# Patient Record
Sex: Female | Born: 1995 | Race: White | Hispanic: No | Marital: Single | Smoking: Former smoker
Health system: Southern US, Community
[De-identification: ages and names within clinical notes are randomized; demographics above are authoritative.]

## PROBLEM LIST (undated history)

## (undated) DIAGNOSIS — T7840XA Allergy, unspecified, initial encounter: Secondary | ICD-10-CM

## (undated) DIAGNOSIS — F988 Other specified behavioral and emotional disorders with onset usually occurring in childhood and adolescence: Secondary | ICD-10-CM

## (undated) HISTORY — DX: Morbid (severe) obesity due to excess calories: E66.01

## (undated) HISTORY — DX: Other specified behavioral and emotional disorders with onset usually occurring in childhood and adolescence: F98.8

## (undated) HISTORY — DX: Allergy, unspecified, initial encounter: T78.40XA

---
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
## 2017-12-29 ENCOUNTER — Emergency Department
Admission: EM | Admit: 2017-12-29 | Discharge: 2017-12-29 | Disposition: A | Discharge status: Home / Self Care | Payer: 59 | Attending: Internal Medicine | Admitting: Internal Medicine

## 2017-12-29 ENCOUNTER — Ambulatory Visit: Payer: 59 | Attending: Medical | Admitting: Medical

## 2017-12-29 ENCOUNTER — Encounter (RURAL_HEALTH_CENTER): Payer: Self-pay | Admitting: Medical

## 2017-12-29 ENCOUNTER — Emergency Department: Payer: 59

## 2017-12-29 VITALS — BP 137/84 | HR 91 | Temp 100.9°F | Resp 20

## 2017-12-29 DIAGNOSIS — J029 Acute pharyngitis, unspecified: Secondary | ICD-10-CM

## 2017-12-29 DIAGNOSIS — J038 Acute tonsillitis due to other specified organisms: Secondary | ICD-10-CM | POA: Insufficient documentation

## 2017-12-29 DIAGNOSIS — J020 Streptococcal pharyngitis: Secondary | ICD-10-CM | POA: Insufficient documentation

## 2017-12-29 DIAGNOSIS — B9689 Other specified bacterial agents as the cause of diseases classified elsewhere: Secondary | ICD-10-CM | POA: Insufficient documentation

## 2017-12-29 LAB — COMPREHENSIVE METABOLIC PANEL
ALT: 52 U/L (ref 0–55)
AST (SGOT): 60 U/L — ABNORMAL HIGH (ref 10–42)
Albumin/Globulin Ratio: 1.08 Ratio (ref 0.80–2.00)
Albumin: 4 gm/dL (ref 3.5–5.0)
Alkaline Phosphatase: 105 U/L (ref 40–145)
Anion Gap: 14.6 mMol/L (ref 7.0–18.0)
BUN / Creatinine Ratio: 9.1 Ratio — ABNORMAL LOW (ref 10.0–30.0)
BUN: 6 mg/dL — ABNORMAL LOW (ref 7–22)
Bilirubin, Total: 0.7 mg/dL (ref 0.1–1.2)
CO2: 27 mMol/L (ref 20.0–30.0)
Calcium: 9.2 mg/dL (ref 8.5–10.5)
Chloride: 102 mMol/L (ref 98–110)
Creatinine: 0.66 mg/dL (ref 0.60–1.20)
EGFR: 128 mL/min/{1.73_m2} (ref 60–150)
Globulin: 3.7 gm/dL (ref 2.0–4.0)
Glucose: 89 mg/dL (ref 71–99)
Osmolality Calculated: 275 mOsm/kg (ref 275–300)
Potassium: 4.3 mMol/L (ref 3.5–5.3)
Protein, Total: 7.7 gm/dL (ref 6.0–8.3)
Sodium: 139 mMol/L (ref 136–147)

## 2017-12-29 LAB — CBC AND DIFFERENTIAL
Bands: 18 % — ABNORMAL HIGH (ref 0–10)
Basophils %: 1 % (ref 0.0–3.0)
Basophils Absolute: 0.1 10*3/uL (ref 0.0–0.3)
Eosinophils %: 1 % (ref 0.0–7.0)
Eosinophils Absolute: 0.1 10*3/uL (ref 0.0–0.8)
Hematocrit: 37.2 % (ref 36.0–48.0)
Hemoglobin: 11.4 gm/dL — ABNORMAL LOW (ref 12.0–16.0)
Lymphocytes Absolute: 2.6 10*3/uL (ref 0.6–5.1)
Lymphocytes: 40 % (ref 15.0–46.0)
MCH: 23 pg — ABNORMAL LOW (ref 28–35)
MCHC: 31 gm/dL (ref 31–36)
MCV: 76 fL — ABNORMAL LOW (ref 80–100)
MPV: 8 fL (ref 6.0–10.0)
Monocytes Absolute: 0.5 10*3/uL (ref 0.1–1.7)
Monocytes: 7 % (ref 3.0–15.0)
Neutrophils %: 33 % — ABNORMAL LOW (ref 42.0–78.0)
Neutrophils Absolute: 3.4 10*3/uL (ref 1.7–8.6)
PLT CT: 134 10*3/uL (ref 130–440)
RBC: 4.91 10*6/uL (ref 3.80–5.00)
RDW: 13.9 % (ref 10.5–14.5)
WBC: 6.6 10*3/uL (ref 4.0–11.0)

## 2017-12-29 LAB — URINE HCG QUALITATIVE: Urine HCG Qualitative: NEGATIVE

## 2017-12-29 LAB — POCT RAPID STREP A: Rapid Strep A Screen POCT: POSITIVE — AB

## 2017-12-29 MED ORDER — AMOXICILLIN 400 MG/5ML PO SUSR
500.00 mg | Freq: Two times a day (BID) | ORAL | 0 refills | Status: AC
Start: 2017-12-29 — End: 2018-01-08

## 2017-12-29 MED ORDER — ACETAMINOPHEN 160 MG/5ML PO SOLN
650.00 mg | Freq: Once | ORAL | Status: AC
Start: 2017-12-29 — End: 2017-12-29
  Administered 2017-12-29: 650 mg via ORAL

## 2017-12-29 MED ORDER — IOHEXOL 350 MG/ML IV SOLN
100.00 mL | Freq: Once | INTRAVENOUS | Status: AC | PRN
Start: 2017-12-29 — End: 2017-12-29
  Administered 2017-12-29: 19:00:00 100 mL via INTRAVENOUS

## 2017-12-29 MED ORDER — METHYLPREDNISOLONE SODIUM SUCC 125 MG IJ SOLR
125.00 mg | Freq: Once | INTRAMUSCULAR | Status: AC
Start: 2017-12-29 — End: 2017-12-29
  Administered 2017-12-29: 18:00:00 125 mg via INTRAVENOUS

## 2017-12-29 MED ORDER — ACETAMINOPHEN 160 MG/5ML PO SOLN
ORAL | Status: AC
Start: 2017-12-29 — End: ?
  Filled 2017-12-29: qty 25.38

## 2017-12-29 MED ORDER — AMOXICILLIN 250 MG/5ML PO SUSR
500.00 mg | Freq: Once | ORAL | Status: AC
Start: 2017-12-29 — End: 2017-12-29
  Administered 2017-12-29: 18:00:00 500 mg via ORAL

## 2017-12-29 MED ORDER — PREDNISONE 20 MG PO TABS
60.00 mg | ORAL_TABLET | Freq: Every day | ORAL | 0 refills | Status: AC
Start: 2017-12-29 — End: 2018-01-03

## 2017-12-29 MED ORDER — METHYLPREDNISOLONE SODIUM SUCC 125 MG IJ SOLR
INTRAMUSCULAR | Status: AC
Start: 2017-12-29 — End: ?
  Filled 2017-12-29: qty 2

## 2017-12-29 MED ORDER — AMOXICILLIN 250 MG/5ML PO SUSR
ORAL | Status: AC
Start: 2017-12-29 — End: ?
  Filled 2017-12-29: qty 80

## 2017-12-29 NOTE — Progress Notes (Signed)
PROGRESS NOTE    Date Time: 12/29/2017 4:19 PM  Patient Name: Tasha [name]  Primary Care Physician: Brion Aliment, MD      History of Presenting Illness:   Yanira A Neumeier is a 22 y.o. female who presents to the office with complaints of a sore throat and fever. She reports her symptoms started 3 days ago. Her Tmax yesterday was 101F. She did take some Tylenol yesterday. She has not taken any medications today. She denies abdominal, nausea, vomiting. She complains of difficulty swallowing, she has only consumed liquids the past 3 days. She denies any difficulty breathing. Other symptoms include a dry cough.     Past Medical History:   No past medical history on file.    Past Surgical History:   No past surgical history on file.    Family History:     Family History   Problem Relation Age of Onset   . Hypertension Mother    . Diabetes Mother    . Thyroid disease Mother    . Migraines Mother    . Polycystic ovary syndrome Mother    . Hypertension Father    . Migraines Maternal Aunt    . Hypertension Maternal Grandmother    . Migraines Maternal Grandmother        Social History:     Social History     Tobacco Use   Smoking Status Never Smoker   Smokeless Tobacco Never Used     Social History     Substance and Sexual Activity   Alcohol Use No     Social History     Substance and Sexual Activity   Drug Use No       Allergies:   No Known Allergies    Medications:     Prior to Admission medications    Medication Sig Start Date End Date Taking? Authorizing Provider   Multiple Vitamin (MULTIVITAMIN) tablet Take 1 tablet by mouth daily.    [provider]       Review of Systems:   Review of Systems   Constitutional: Positive for fever. Negative for appetite change.        Facial edema   HENT: Positive for trouble swallowing and voice change. Negative for congestion, sinus pressure and sinus pain.    Respiratory: Positive for cough.    Gastrointestinal: Negative for abdominal pain, nausea and vomiting.        Physical Exam:     Vitals:    12/29/17 1542   BP: 137/84   Pulse: 91   Resp: 20   Temp: (!) 100.9 F (38.3 C)   SpO2: 98%     There is no height or weight on file to calculate BMI.    Physical Exam  Vitals signs and nursing note reviewed.   Constitutional:       Appearance: She is well-developed.   HENT:      Head: Normocephalic and atraumatic.      Comments: Periorbital edema     Mouth/Throat:      Pharynx: Oropharyngeal exudate and posterior oropharyngeal erythema present.      Tonsils: Swelling: 4+ on the right. 4+ on the left.   Cardiovascular:      Rate and Rhythm: Normal rate and regular rhythm.      Heart sounds: Normal heart sounds.   Pulmonary:      Effort: Pulmonary effort is normal.      Breath sounds: Normal breath sounds.   Skin:  Findings: No rash.   Neurological:      Mental Status: She is alert and oriented to person, place, and time.       Rapid strep done today 12/29/17    POCT QC Pass   Final   Rapid Strep A Screen POCT PositiveAbnormal   Negative Final   Comment   Final       Assessment:     1. Pharyngitis, unspecified etiology  POCT Rapid strep A    Throat Culture       Plan:     Due to her complaints of difficulty and painful swallowing will refer to ED for imaging and labs. Rapid strep was faintly positive in clinic. Patient and mother are agreeable with plan of care. Report called to ER physician.     Signed by: Everlene Other, PA-C

## 2017-12-29 NOTE — Discharge Instructions (Signed)
Pharyngitis, Strep    You have been diagnosed with strep pharyngitis.    Strep pharyngitis (strep throat) is an infection in the back of the throat and tonsils. It is caused by bacteria called Streptococcus pyogenes. The word "pharyngitis" means "sore throat." The doctor might use a test to find out whether your child's sore throat was caused by strep bacteria or a virus. If a virus caused the sore throat, antibiotics will not help. Taking antibiotics when they are not needed is dangerous because it leads to resistance. This means the drugs will not work as well when they are needed the next time.    Symptoms of strep pharyngitis include fever (temperature higher than 100.4F / 38C), sore throat, painful swallowing, headache, abdominal pain and vomiting. You might see a rash that feels like sandpaper. You might have swollen tender neck glands. There are usually white spots on the tonsils.    Most people with cold symptoms (runny or stuffy nose or cough) do NOT have strep throat, even if their throats are sore. The doctor might not test you for strep throat. Some people have strep bacteria in the throat all the time but don't get sick. We call these people "carriers." A carrier will always test positive for strep, even though the strep didn't cause your sore throat. If all 4 of the following are true, it is likely your sore throat was caused by strep bacteria:   You have a fever (temperature higher than 100.4F / 38C), either here or at home.   There are white spots in the back of your throat.   Your lymph nodes (glands) are swollen.   You have NO other cold symptoms, like a stuffy nose, runny nose, or cough.    Strep pharyngitis is treated with antibiotics, medicine for pain and fever (temperature higher than 100.4F / 38C), and fluids. It is VERY IMPORTANT that you take all of the antibiotics as directed, even if you are feeling better before the antibiotics are finished.    YOU SHOULD SEEK MEDICAL  ATTENTION IMMEDIATELY, EITHER HERE OR AT THE NEAREST EMERGENCY DEPARTMENT, IF ANY OF THE FOLLOWING OCCURS:   You have trouble breathing.   You are hoarse or your voice changes.   You drool or have difficulty swallowing.   Your sore throat gets worse or you start to have neck pain.   You are unable to take liquids or medicine.   You get worse at any time or do not get better in 2 to 3 days.

## 2017-12-29 NOTE — ED Provider Notes (Signed)
Physician/Midlevel provider first contact with patient: 12/29/17 1720         History     Chief Complaint   Patient presents with   . Sore Throat     The patient is a very pleasant 22 year old woman who presents to the ER complaining of a 2-day history of a sore throat. She was seen by her primary care provider just prior to arrival and was noted to have significantly swollen tonsils. For this reason, she was advised to come to the ER. She did have a strep test that came back positive at the primary care provider's office. The patient reports that she has moderate, sharp, non radiating pain located in the back of her throat that is worsened with oral intake. She is able to swallow liquid without any difficulty whatsoever and she has no shortness of breath. However, she reports that when she swallows solids, she has some mild difficulty with this. She has no lightheadedness or dizziness or syncope or presyncope. She has fevers and chills. She has not been on antibiotics recently. She confirms that she is not pregnant.    History reviewed. No pertinent past medical history.  Morbid obesity    History reviewed. No pertinent surgical history.  No coagulopathy    Family History   Problem Relation Age of Onset   . Hypertension Mother    . Diabetes Mother    . Thyroid disease Mother    . Migraines Mother    . Polycystic ovary syndrome Mother    . Hypertension Father    . Migraines Maternal Aunt    . Hypertension Maternal Grandmother    . Migraines Maternal Grandmother        Social  Social History     Tobacco Use   . Smoking status: Never Smoker   . Smokeless tobacco: Never Used   Substance Use Topics   . Alcohol use: No   . Drug use: No       .     No Known Allergies    Home Medications     Med List Status:  Complete Set By: Osa Craver, RN at 12/29/2017  5:34 PM                Multiple Vitamin (MULTIVITAMIN) tablet     Take 1 tablet by mouth daily.           Review of Systems   ROS Statements  All systems rev &  neg except as marked.    Constitutional     Denies:  Fatigue, Lethargy, Malaise, Recent wt loss, Weakness - generalized.   GI  Denies: Abdominal pain, Anorexia, Belching, Bloody/tarry stool, Constipation, Diarrhea, Dysphagia, Hematemesis, Hematochezia, Mucousy stool, Melena, Nausea, Rectal pain, Vomiting.   GU   Denies: Dysuria, Flank pain, Hematuria, Incontinence, Nocturia, Urinary frequency, Urinary urgency, Urination decreased, Urination increased.   Musculoskeletal     Denies: Back pain, Extremity pain, Extremity swelling, Joint pain, Joint swelling, Lumbar pain, Myalgia, Neck pain, Thoracic pain.   Skin     Denies: Abrasion, Abscess, Burn, Contusion, Diaphoresis, Erythema, Itching, Jaundice, Laceration, Rash, Swelling, Ulceration.   Eyes     Denies: Blurred R, Blurred L, Blurred bilat, Diplopia, Discharge R, Discharge L, Discharge bilat, Eye pain R, Eye pain L, Eye pain bilat, Photophobia, Redness R, Redness L, Redness bilat, Swelling R, Swelling L, Swelling bilat, Visual loss R, Visual loss L, Visual loss bilat, Yellow R, Yellow L, Yellow bilat.   Ears/Nose/Throat  Denies: Ear drainage R, Ear drainage L, Ear drainage bilat, Ear ringing R, Ear ringing L, Ear ringing bilat, Earache R, Earache L, Earache bilat, Hearing loss R, Hearing loss L, Hearing loss bilat, Mouth pain, Nasal congestion, Nose bleeding, Sinus problem, Tongue pain, Tongue swelling, Toothache.   Respiratory     Denies: Cough, non-productive, Cough, productive, Dyspnea on exertion, Hemoptysis, Parox nocturnal dyspnea, Pleuritic pain, Shortness of breath, Wheezing.   Cardiovascular     Denies: Chest pain, Dyspnea on exertion, Edema, Orthopnea, Palpitations, Parox nocturnal dyspnea, Syncope.   Hematologic     Denies: Bleeding, Bruising, Petechiae.   Endocrine     Denies: Cold intolerance, Heat intolerance, Polydipsia, Polyphagia, Polyuria, Weight gain, Weight loss.   Allergy/Immun     Denies: Allergic reaction, Anaphylaxis, Hives, Itching,  Rhinorrhea, Sneezing.   Neurologic     Denies: Abnormal movement, Bladder dysfunction, Bowel dysfunction, Change LOC, Confusion, Dizziness, Focal weakness, Generalized weakness, Headache, Lightheaded, Numbness, Problem walking, Seizure, Shaking, Slurred speech, Spinning sensation, Syncope, Tingling, Unable to speak, Vision change.   Psychiatric     Denies: Agitation, Anxiety, Change mental status, Confusion, Delusional, Depression, Hallucinations, auditory, Hallucinations, visual, Homicidal ideation, Hostile, Insomnia, Stress, Suicidal ideation, Unable to control self.     Physical Exam    BP: 161/86, Heart Rate: 99, Temp: (!) 101.1 F (38.4 C), Resp Rate: 16, SpO2: 100 %, Weight: 146.1 kg     General/Const     General/Const Awake, Alert, No acute distress, Well appearing, Well developed, Well hydrated, Well nourished, Cooperative, Not toxic appearing  Abdomen/GI     Abdomen/GI Atraumatic, Soft, Non-tender, McBurney's non-tender, No guarding, No rebound, BS normoactive, No distention, No palpable mass, No pulsatile mass  Skin     Skin Atraumatic, Color NL, No rash, Warm, Dry, Intact, Turgor NL, No swelling  Genitourinary         Patient refused exam  MS Head     Head Atraumatic, Normocephalic  Eyes     Eyes Atraumatic, PERRL, EOMI, No nystagmus, No periorbital redness, No periorbital swelling, No photophobia, No scleral icterus, Conjunctiva NL, Cornea clear, Eyelids NL, Temporal arteries NL, Visual acuity NL  Ears/Nose/Throat     Ears/Nose/Throat Atraumatic, Airway patent, Mucous membranes moist, Pharynx reddened with tonsils enlarged bilaterally with pus noted on them c/w pustular tonsillitis, No peritonsillar abscess, No pooling of secretions, No trismus, Tympanic membs NL, Ext aud canal NL, Mastoid area NL, Nose exam NL, No sinus tenderness, No facial swelling  MS Neck     Neck Atraumatic, Supple, No meningismus, Full range of motion, + cervical anterior adenopathy, Non-tender, No midline vertebral tend, No  masses, No crepitus, No JVD, No carotid bruit, No tracheal deviation  Resp/Chest     Respiratory/Chest Atraumatic, Breath sounds NL, Breath sounds = bilat, No respiratory distress, No rales, No rhonchi, No wheezing, No retractions, No stridor, No chest tenderness, No chest wall deformity, No crepitus  Cardiovascular     Cardiovascular Heart rate NL, Regular rhythm, Heart sounds NL, No gallop, No murmurs, No rubs, Cap refill not delayed, Peripheral circulation NL, Pulses = bilaterally, No gross BP differential  MS Back     Back Atraumatic, Inspection NL, Full range of motion, Painless range of motion, Non-tender, No midline vertebral tend, No paraspinal tenderness, No muscle spasm, Straight leg raise neg, No CVA tenderness  Lymphatic     Lymphatic No gross adenopathy, No cervical adenopathy, No axillary adenopathy, No inguinal adenopathy  MS Upper Extrem     Upper Extremity/MS Atraumatic,  Inspection NL, Full range of motion, No swelling, Non-tender, No snuffbox tenderness, No erythema, No deformity, Neurologic intact, Vascular intact, No ligamentous injury, Tendon function NL, No compartment syndrome, No circumferential injury, No clubbing/cyanosis, No edema  MS Wrist/Hand     Wrist/Hand Atraumatic, Inspection NL, Full range of motion, No swelling, No erythema, Non-tender, No snuffbox tenderness, No deformity, Neurologic intact, Vascular intact, No ligamentous injury, Tendon function NL, No compartment syndrome, No circumferential injury, No clubbing/cyanosis, No edema  MS Lower Extrem     Lower Ext/Pelvis/MS Atraumatic, Inspection NL, Full range of motion, No swelling, Non-tender, No erythema, No deformity, Neurologic intact, Vascular intact, No ligamentous injury, Tendon function NL, No compartment syndrome, No circumferential injury, No edema, Gait NL, Pelvis stable, Pelvis non-tender  MS Ankle/Foot     Ankle/Foot Atraumatic, Inspection NL, Full range of motion, No swelling, No erythema, Non-tender, No deformity,  Neurologic intact, Vascular intact, No ligamentous injury, Tendon function NL, No compartment syndrome, No circumferential injury, No edema, Gait NL  Rectum     Rectum/Perineum Patient refused exam  Neurologic     Neurologic Oriented X3, Speech NL, No motor deficits, No sensory deficits, CN II - XII intact, Reflexes equal bilat, Cerebellar NL, Memory NL, Gait NL  Psychiatric     Psychiatric Affect NL, Mood NL, Not suicidal, Not homicidal, No hallucinations, Cognitive function NL, Judgment/insight NL, Thought content NL    MDM and ED Course     ED Medication Orders (From admission, onward)    Start Ordered     Status Ordering Provider    12/29/17 1849 12/29/17 1849  iohexol (OMNIPAQUE) 350 MG/ML injection 100 mL  IMG once as needed     Route: Intravenous  Ordered Dose: 100 mL     Last MAR action:  Imaging Agent Given Osa Craver    12/29/17 1746 12/29/17 1745  methylPREDNISolone sodium succinate (Solu-MEDROL) injection 125 mg  Once in ED     Route: Intravenous  Ordered Dose: 125 mg     Last MAR action:  Given Osa Craver    12/29/17 1746 12/29/17 1745  amoxicillin (AMOXIL) 250 MG/5ML oral suspension 500 mg  Once in ED     Route: Oral  Ordered Dose: 500 mg     Last MAR action:  Given Osa Craver    12/29/17 1737 12/29/17 1736  acetaminophen (TYLENOL) 160 MG/5ML oral solution 650 mg  Once in ED     Route: Oral  Ordered Dose: 650 mg     Last MAR action:  Given Karena Kinker, Murlean Iba S           MDM  The patient is resting comfortably and is well-appearing and in no acute distress. There is no respiratory distress, no stridor, and the mental status is normal. The neurologic examination is normal, there is no significant dehydration, and the patient is able to tolerate PO fluids. The history, exam, diagnostic testing, and the patient's current condition do not suggest an infectious process such as meningitis, retropharyngeal abscess, epiglottitis, peritonsillar abscess, Ludwig's angina, mastoiditis, orbital cellulitis,  periorbital cellulitis, severe sinusitis, malignant otitis externa, sepsis or other significant pathology to warrant further testing, continued ED treatment, admission, consultation or other specialist evaluation at this time. The vital signs have been stable. The patient's condition is stable and appropriate for discharge.     Ct Soft Tissue Neck W Contrast    Result Date: 12/29/2017  Adenoid and palatine tonsillar hypertrophy bilaterally. No organized/drainable fluid collection. Marked bilateral cervical  lymphadenopathy. ReadingStation:[id number]      Procedures    Clinical Impression & Disposition     Clinical Impression  Final diagnoses:   Acute streptococcal pharyngitis   Acute bacterial tonsillitis        ED Disposition     ED Disposition Condition Date/Time Comment    Discharge  Mon Dec 29, 2017  7:15 PM Tasha [name] discharge to home/self care.    Condition at disposition: Stable           New Prescriptions    AMOXICILLIN (AMOXIL) 400 MG/5ML SUSPENSION    Take 6.5 mLs (520 mg total) by mouth 2 (two) times daily for 10 days    PREDNISONE (DELTASONE) 20 MG TABLET    Take 3 tablets (60 mg total) by mouth daily for 5 days        DISCHARGE:  I have spoken with the patient and/or caregivers. I have explained the patient's condition, diagnoses and treatment plan based on the information available to me at this time. I have answered the patient's and/or caregiver's questions and addressed any concerns. The patient and/or caregivers have as good an understanding of the patient's diagnosis, condition and treatment plan as can be expected at this point. The vital signs have been stable. The patient's condition is stable and appropriate for discharge from the emergency department.  The patient will pursue further outpatient evaluation with the primary care physician or other designated or consulting physician as outlined in the discharge instructions. The patient and/or caregivers are agreeable to this plan of  care and follow-up instructions have been explained in detail. The patient and/or caregivers have received these instructions in written format and have expressed an understanding of the discharge instructions. The patient and/or caregivers are aware that any significant change in condition or worsening of symptoms should prompt an immediate return to this or the closest emergency department or a call to 911.       Osa Craver, MD  12/29/17 [phone]

## 2018-04-21 ENCOUNTER — Other Ambulatory Visit
Admission: RE | Admit: 2018-04-21 | Discharge: 2018-04-21 | Disposition: A | Discharge status: Home / Self Care | Payer: 59 | Source: Ambulatory Visit | Attending: Family Medicine | Admitting: Family Medicine

## 2018-04-21 ENCOUNTER — Ambulatory Visit: Payer: 59 | Attending: Medical | Admitting: Family Medicine

## 2018-04-21 ENCOUNTER — Encounter (RURAL_HEALTH_CENTER): Payer: Self-pay | Admitting: Medical

## 2018-04-21 VITALS — BP 151/76 | HR 70 | Temp 97.9°F | Resp 18

## 2018-04-21 DIAGNOSIS — J029 Acute pharyngitis, unspecified: Secondary | ICD-10-CM

## 2018-04-21 LAB — POCT RAPID STREP A: Rapid Strep A Screen POCT: NEGATIVE

## 2018-04-21 MED ORDER — PREDNISONE 20 MG PO TABS
40.00 mg | ORAL_TABLET | Freq: Every day | ORAL | 0 refills | Status: AC
Start: 2018-04-21 — End: ?

## 2018-04-21 MED ORDER — CEPHALEXIN 500 MG PO CAPS
500.00 mg | ORAL_CAPSULE | Freq: Three times a day (TID) | ORAL | 0 refills | Status: AC
Start: 2018-04-21 — End: 2018-05-01

## 2018-04-21 NOTE — Progress Notes (Signed)
Subjective:    Patient ID: Tasha [name] is a 23 y.o. female.    HPI  The patient presents today with a history of a sore throat over the course of the last several days.  She has taken vitamins that she purchased at [facility] to try to help boost her immune system which she thinks have helped somewhat.  She has not had a documented fever.  She does have a history of severe streptococcal pharyngitis in the past.  She reports that when she took amoxicillin she broke out with a nonpruritic rash approximately 4 days into the antibiotic course.  The following portions of the patient's history were reviewed and updated as appropriate: allergies, current medications, past family history, past medical history, past social history and problem list.    Review of Systems   Constitutional: Negative for fever.   HENT: Positive for sore throat.            Objective:    Physical Exam  Constitutional:       Appearance: Normal appearance.      Comments: The patient does speak with a hot potato voice   HENT:      Head: Normocephalic and atraumatic.      Mouth/Throat:      Mouth: Mucous membranes are moist.      Comments: Significant tonsillar enlargement without exudate  Eyes:      General: No scleral icterus.  Neck:      Musculoskeletal: Neck supple.   Cardiovascular:      Rate and Rhythm: Normal rate and regular rhythm.   Pulmonary:      Effort: Pulmonary effort is normal.      Breath sounds: Normal breath sounds.   Neurological:      Mental Status: She is alert.             Assessment:       1. Sore throat          Plan:       Rapid strep test was negative.  Given her history, however, I will cover with antibiotics and steroids until the culture returns.  Patient to follow-up if worsening in any way.

## 2018-04-23 ENCOUNTER — Telehealth (RURAL_HEALTH_CENTER): Payer: Self-pay | Admitting: Family Medicine

## 2018-04-23 NOTE — Telephone Encounter (Signed)
Throat culture negative, good news

## 2018-04-23 NOTE — Telephone Encounter (Signed)
Left detailed message.     Thanks,   CF

## 2018-09-01 ENCOUNTER — Emergency Department (HOSPITAL_COMMUNITY)
Admission: EM | Admit: 2018-09-01 | Discharge: 2018-09-01 | Disposition: A | Discharge status: Home / Self Care | Payer: 59 | Attending: Emergency Medicine | Admitting: Emergency Medicine

## 2018-09-01 ENCOUNTER — Other Ambulatory Visit: Payer: Self-pay

## 2018-09-01 ENCOUNTER — Emergency Department (HOSPITAL_COMMUNITY): Payer: 59

## 2018-09-01 ENCOUNTER — Encounter (HOSPITAL_COMMUNITY): Payer: Self-pay | Admitting: *Deleted

## 2018-09-01 DIAGNOSIS — M25552 Pain in left hip: Secondary | ICD-10-CM

## 2018-09-01 DIAGNOSIS — Z720 Tobacco use: Secondary | ICD-10-CM | POA: Diagnosis not present

## 2018-09-01 DIAGNOSIS — M545 Low back pain, unspecified: Secondary | ICD-10-CM

## 2018-09-01 MED ORDER — NAPROXEN 500 MG PO TABS: 500.0000 mg | ORAL_TABLET | Freq: Two times a day (BID) | ORAL | 0 refills | Status: AC

## 2018-09-01 NOTE — Discharge Instructions (Signed)
Please see the information and instructions below regarding your visit.  Your diagnoses today include:  1. Acute left-sided low back pain without sciatica   2. Left hip pain    About diagnosis. Most episodes of acute low back pain are self-limited. Your exam was reassuring today that the source of your pain is not affecting the spinal cord and nerves that originate in the spinal cord.   Tests performed today include: See side panel of your discharge paperwork for testing performed today. Vital signs are listed at the bottom of these instructions.   Medications prescribed:    Take any prescribed medications only as prescribed, and any over the counter medications only as directed on the packaging.  You are prescribed naproxen, a non-steroidal anti-inflammatory agent (NSAID) for pain. You may take 500 mg every 12 hours as needed for pain. If still requiring this medication around the clock for acute pain after 10 days, please see your primary healthcare provider.  Women who are pregnant, breastfeeding, or planning on becoming pregnant should not take non-steroidal anti-inflammatories such as Advil and Aleve. Tylenol is a safe over the counter pain reliever in pregnant women.  You may combine this medication with Tylenol, 650 mg every 6 hours, so you are receiving something for pain every 3 hours.  This is not a long-term medication unless under the care and direction of your primary provider. Taking this medication long-term and not under the supervision of a healthcare provider could increase the risk of stomach ulcers, kidney problems, and cardiovascular problems such as high blood pressure.    Home care instructions:   Low back pain gets worse the longer you stay stationary. Please keep moving and walking as tolerated. There are exercises included in this packet to perform as tolerated for your low back pain.   Apply heat to the areas that are painful. Avoid twisting or bending your  trunk to lift something. Do not lift anything above 25 lbs while recovering from this flare of low back pain.  Please follow any educational materials contained in this packet.   Follow-up instructions: Please follow-up with your primary care provider as needed for further evaluation of your symptoms if they are not completely improved.   Please follow up with Dr. Aline Brochure of orthopedics if not improving in one week.   Return instructions:  Please return to the Emergency Department if you experience worsening symptoms.  Please return for any fever or chills in the setting of your back pain, weakness in the muscles of the legs, numbness in your legs and feet that is new or changing, numbness in the area where you wipe, retention of your urine, loss of bowel or bladder control, or problems with walking. Please return if you have any other emergent concerns.  Additional Information:   Your vital signs today were: BP (!) 131/96 (BP Location: Right Arm)    Pulse 90    Temp 98.4 F (36.9 C) (Oral)    Resp 18    Ht 6\' 2"  (1.88 m)    Wt 113.4 kg    SpO2 100%    BMI 32.10 kg/m  If your blood pressure (BP) was elevated on multiple readings during this visit above 130 for the top number or above 80 for the bottom number, please have this repeated by your primary care provider within one month. --------------  Thank you for allowing [place] to participate in your care today.

## 2018-09-01 NOTE — ED Triage Notes (Signed)
Pt states she injured her left hip a few years ago and still continues to have pain if she overuses; pt states she went swimming x 5 days ago and has been having pain to that left leg and hip since; pt denies any new injuries

## 2018-09-01 NOTE — ED Provider Notes (Signed)
[facility] EMERGENCY DEPARTMENT Provider Note   CSN: [id number] Arrival date & time: 09/01/18  1501     History   Chief Complaint Chief Complaint  Patient presents with  . Hip Pain    HPI Madison Clay is a 23 y.o. female.     HPI  Patient is a 23 year old female with past medical history of ADD and allergic rhinitis presenting for left back and hip pain.  Patient reports that she injured her back with some aggressive swimming 3 days ago.  She reports that she had a back injury 2 years ago and has intermittent symptoms of pain with lifting her leg ever since.  Currently, she reports that she has the greatest pain with hip flexion.  She denies any weakness or numbness in lower extremities, saddle anesthesia, loss of bowel or bladder control, fever or chills, IVDU history of cancer history.  Past Medical History:  Diagnosis Date  . ADD (attention deficit disorder)   . Allergy     There are no active problems to display for this patient.   History reviewed. No pertinent surgical history.   OB History   No obstetric history on file.      Home Medications    Prior to Admission medications   Medication Sig Start Date End Date Taking? Authorizing Provider  naproxen (NAPROSYN) 500 MG tablet Take 1 tablet (500 mg total) by mouth 2 (two) times daily. 09/01/18   Albesa Seen, PA-C    Family History History reviewed. No pertinent family history.  Social History Social History   Tobacco Use  . Smoking status: Current Some Day Smoker    Types: Cigarettes  . Smokeless tobacco: Never Used  . Tobacco comment: social smoker  Substance Use Topics  . Alcohol use: Yes    Comment: mostly on weekends  . Drug use: Yes    Types: Marijuana     Allergies   Patient has no known allergies.   Review of Systems Review of Systems  Constitutional: Negative for chills and fever.  Musculoskeletal: Positive for arthralgias and back pain.  Neurological: Negative for  weakness and numbness.     Physical Exam Updated Vital Signs BP (!) 131/96 (BP Location: Right Arm)   Pulse 90   Temp 98.4 F (36.9 C) (Oral)   Resp 18   Ht 6\' 2"  (1.88 m)   Wt 113.4 kg   SpO2 100%   BMI 32.10 kg/m   Physical Exam Vitals signs and nursing note reviewed.  Constitutional:      General: She is not in acute distress.    Appearance: She is well-developed. She is not diaphoretic.     Comments: Sitting comfortably in bed.  HENT:     Head: Normocephalic and atraumatic.  Eyes:     General:        Right eye: No discharge.        Left eye: No discharge.     Conjunctiva/sclera: Conjunctivae normal.     Comments: EOMs normal to gross examination.  Neck:     Musculoskeletal: Normal range of motion.  Cardiovascular:     Rate and Rhythm: Normal rate and regular rhythm.     Comments: Intact, 2+ DP pulse bilaterally. Pulmonary:     Comments: Converses comfortably.  No audible wheeze or stridor. Abdominal:     General: There is no distension.  Musculoskeletal: Normal range of motion.     Right lower leg: No edema.     Left lower  leg: No edema.     Comments: Spine Exam: Inspection/Palpation: No midline TTP of lumbar spine. Paraspinal muscular tenderness of left lumbar spine and left hip. Pain primary with passive/active hip flexion. Strength: 5/5 throughout LE bilaterally (hip flexion/extension, adduction/abduction; knee flexion/extension; foot dorsiflexion/plantarflexion, inversion/eversion; great toe inversion) Sensation: Intact to light touch in proximal and distal LE bilaterally Reflexes: 2+ quadriceps and achilles reflexes Normal and symmetric gait.   Skin:    General: Skin is warm and dry.  Neurological:     Mental Status: She is alert.     Comments: Cranial nerves intact to gross observation. Patient moves extremities without difficulty.  Psychiatric:        Behavior: Behavior normal.        Thought Content: Thought content normal.        Judgment:  Judgment normal.      ED Treatments / Results  Labs (all labs ordered are listed, but only abnormal results are displayed) Labs Reviewed - No data to display  EKG None  Radiology Dg Hip Unilat W Or Wo Pelvis 2-3 Views Left  Result Date: 09/01/2018 CLINICAL DATA:  Lateral left hip pain without injury. EXAM: DG HIP (WITH OR WITHOUT PELVIS) 2-3V LEFT COMPARISON:  None. FINDINGS: There is no evidence of hip fracture or dislocation. There is no evidence of arthropathy or other focal bone abnormality. IMPRESSION: Negative. Electronically Signed   By: Ted Mcalpineobrinka  Dimitrova M.D.   On: 09/01/2018 16:47    Procedures Procedures (including critical care time)  Medications Ordered in ED Medications - No data to display   Initial Impression / Assessment and Plan / ED Course  I have reviewed the triage vital signs and the nursing notes.  Pertinent labs & imaging results that were available during my care of the patient were reviewed by me and considered in my medical decision making (see chart for details).        This is a well-appearing 23 year old female with no significant past medical history presenting for left hip and left low back pain.  This was in the setting of an injury.  Patient has no red flag features for back or hip pain.  No history of immune compromise status.  Radiograph of the left hip demonstrates no bony lesions.  Reviewed by me.  Patient denies any chance of pregnancy and declines pregnancy test today.  Will recommend NSAIDs, activity restriction, and heat.  She is instructed to follow-up with orthopedics if not improving.  Patient is in understanding and agrees with the plan of care.  Final Clinical Impressions(s) / ED Diagnoses   Final diagnoses:  Acute left-sided low back pain without sciatica  Left hip pain    ED Discharge Orders         Ordered    naproxen (NAPROSYN) 500 MG tablet  2 times daily     09/01/18 1717           Elisha PonderMurray, Khaliq Turay B, PA-C  09/01/18 1815    Samuel JesterMcManus, Kathleen, DO 09/04/18 1544

## 2019-05-27 ENCOUNTER — Ambulatory Visit: Payer: 59 | Attending: Internal Medicine

## 2019-05-27 ENCOUNTER — Other Ambulatory Visit: Payer: Self-pay

## 2019-05-27 DIAGNOSIS — Z23 Encounter for immunization: Secondary | ICD-10-CM

## 2019-05-27 NOTE — Progress Notes (Signed)
   Covid-19 Vaccination Clinic  Name:  KYNZIE POLGAR    MRN: [id number] DOB: 1995/06/29  05/27/2019  Ms. Sortino was observed post Covid-19 immunization for 15 minutes without incident. She was provided with Vaccine Information Sheet and instruction to access the V-Safe system.   Ms. Dissinger was instructed to call 911 with any severe reactions post vaccine: Marland Kitchen Difficulty breathing  . Swelling of face and throat  . A fast heartbeat  . A bad rash all over body  . Dizziness and weakness   Immunizations Administered    Name Date Dose VIS Date Route   Moderna COVID-19 Vaccine 05/27/2019  8:18 AM 0.5 mL 01/19/2019 Intramuscular   Manufacturer: Moderna   Lot: [id number]   NDC: 58832-549-82

## 2019-06-29 ENCOUNTER — Ambulatory Visit: Payer: 59 | Attending: Internal Medicine

## 2019-06-29 DIAGNOSIS — Z23 Encounter for immunization: Secondary | ICD-10-CM

## 2019-06-29 NOTE — Progress Notes (Signed)
   Covid-19 Vaccination Clinic  Name:  Madison Clay    MRN: [id number] DOB: October 28, 1995  06/29/2019  [name] was observed post Covid-19 immunization for 15 minutes without incident. She was provided with Vaccine Information Sheet and instruction to access the V-Safe system.   [name] was instructed to call 911 with any severe reactions post vaccine: Marland Kitchen Difficulty breathing  . Swelling of face and throat  . A fast heartbeat  . A bad rash all over body  . Dizziness and weakness   Immunizations Administered    Name Date Dose VIS Date Route   Moderna COVID-19 Vaccine 06/29/2019  9:24 AM 0.5 mL 01/2019 Intramuscular   Manufacturer: Moderna   Lot: [id number]   NDC: 40102-725-36

## 2019-06-30 ENCOUNTER — Other Ambulatory Visit: Payer: Self-pay

## 2019-06-30 ENCOUNTER — Emergency Department (HOSPITAL_COMMUNITY)
Admission: EM | Admit: 2019-06-30 | Discharge: 2019-06-30 | Disposition: A | Discharge status: Home / Self Care | Payer: Self-pay | Attending: Emergency Medicine | Admitting: Emergency Medicine

## 2019-06-30 ENCOUNTER — Encounter (HOSPITAL_COMMUNITY): Payer: Self-pay | Admitting: Emergency Medicine

## 2019-06-30 DIAGNOSIS — F909 Attention-deficit hyperactivity disorder, unspecified type: Secondary | ICD-10-CM | POA: Insufficient documentation

## 2019-06-30 DIAGNOSIS — R519 Headache, unspecified: Secondary | ICD-10-CM | POA: Insufficient documentation

## 2019-06-30 DIAGNOSIS — Z79899 Other long term (current) drug therapy: Secondary | ICD-10-CM | POA: Insufficient documentation

## 2019-06-30 DIAGNOSIS — Z87891 Personal history of nicotine dependence: Secondary | ICD-10-CM | POA: Insufficient documentation

## 2019-06-30 MED ORDER — DEXAMETHASONE SODIUM PHOSPHATE 10 MG/ML IJ SOLN
10.0000 mg | Freq: Once | INTRAMUSCULAR | Status: AC
  Administered 2019-06-30: 16:00:00 10 mg via INTRAVENOUS
  Filled 2019-06-30: qty 1

## 2019-06-30 MED ORDER — PROMETHAZINE HCL 25 MG PO TABS: 25.0000 mg | ORAL_TABLET | Freq: Four times a day (QID) | ORAL | 0 refills | Status: AC | PRN

## 2019-06-30 MED ORDER — DIPHENHYDRAMINE HCL 50 MG/ML IJ SOLN
25.0000 mg | Freq: Once | INTRAMUSCULAR | Status: AC
Start: 2019-06-30 — End: 2019-06-30
  Administered 2019-06-30: 25 mg via INTRAVENOUS
  Filled 2019-06-30: qty 1

## 2019-06-30 MED ORDER — SODIUM CHLORIDE 0.9 % IV BOLUS
1000.0000 mL | Freq: Once | INTRAVENOUS | Status: AC
  Administered 2019-06-30: 1000 mL via INTRAVENOUS

## 2019-06-30 MED ORDER — PROCHLORPERAZINE EDISYLATE 10 MG/2ML IJ SOLN
10.0000 mg | Freq: Once | INTRAMUSCULAR | Status: AC
  Administered 2019-06-30: 16:00:00 10 mg via INTRAVENOUS
  Filled 2019-06-30: qty 2

## 2019-06-30 NOTE — ED Triage Notes (Signed)
Had 2nd moderna covid vaccine yesterday. Woke up with headache this morning. Took tylenol at 100 with no relief

## 2019-06-30 NOTE — Discharge Instructions (Addendum)
I suspect your headache and your low grade fever is a side effect of your 2nd covid vaccine as this a very common reaction to the second dose.  Rest, make sure you are drinking plenty of fluids.  You may continue taking tylenol or motrin if needed for a return of your headache.  I have prescribed you phenergan which you can take if your nausea returns.

## 2019-06-30 NOTE — ED Notes (Signed)
Pt ambulatory to er room number 22, pt states that she got her mederna shot yesterday and today has a headache and some nausea.  resps even and unlabored

## 2019-07-02 NOTE — ED Provider Notes (Signed)
[facility] EMERGENCY DEPARTMENT Provider Note   CSN: [id number] Arrival date & time: 06/30/19  1308     History Chief Complaint  Patient presents with  . Headache    Madison Clay is a 24 y.o. female presenting with suspected side effects from her second [place] 15 vaccine which she received yesterday.  She woke this am with a bad generalized headache in addition to nausea without emesis.  She denies fever, measured her temperature which was 99.5. She denies photophobia, neck pain or stiffness, abdominal pain, diarrhea, also no cp, sob.  She denies weakness or numbness in her extremities, also denies dizziness.  She took tylenol prior to arrival without improvement in her headache.    HPI     Past Medical History:  Diagnosis Date  . ADD (attention deficit disorder)   . Allergy     There are no problems to display for this patient.   History reviewed. No pertinent surgical history.   OB History   No obstetric history on file.     No family history on file.  Social History   Tobacco Use  . Smoking status: Former Smoker    Types: Cigarettes  . Smokeless tobacco: Never Used  . Tobacco comment: social smoker  Substance Use Topics  . Alcohol use: Yes    Comment: mostly on weekends  . Drug use: Yes    Types: Marijuana    Home Medications Prior to Admission medications   Medication Sig Start Date End Date Taking? Authorizing Provider  naproxen (NAPROSYN) 500 MG tablet Take 1 tablet (500 mg total) by mouth 2 (two) times daily. 09/01/18   Albesa Seen, PA-C  promethazine (PHENERGAN) 25 MG tablet Take 1 tablet (25 mg total) by mouth every 6 (six) hours as needed for nausea or vomiting. 06/30/19   Evalee Jefferson, PA-C    Allergies    Patient has no known allergies.  Review of Systems   Review of Systems  Constitutional: Negative for chills and fever.  HENT: Negative for congestion and sore throat.   Eyes: Negative.  Negative for photophobia, pain and  visual disturbance.  Respiratory: Negative for chest tightness and shortness of breath.   Cardiovascular: Negative for chest pain.  Gastrointestinal: Positive for nausea. Negative for abdominal pain, constipation, diarrhea and vomiting.  Genitourinary: Negative.   Musculoskeletal: Negative for arthralgias, joint swelling and neck pain.  Skin: Negative.  Negative for rash and wound.  Neurological: Positive for headaches. Negative for dizziness, weakness, light-headedness and numbness.  Psychiatric/Behavioral: Negative.     Physical Exam Updated Vital Signs BP (!) 141/91 (BP Location: Left Arm)   Pulse 98   Temp 98.5 F (36.9 C) (Oral)   Resp 18   Ht 6\' 1"  (1.854 m)   Wt 113.4 kg   SpO2 100%   BMI 32.98 kg/m   Physical Exam Vitals and nursing note reviewed.  Constitutional:      Appearance: She is well-developed.     Comments: Uncomfortable appearing  HENT:     Head: Normocephalic and atraumatic.  Eyes:     Pupils: Pupils are equal, round, and reactive to light.  Cardiovascular:     Rate and Rhythm: Normal rate.     Heart sounds: Normal heart sounds.  Pulmonary:     Effort: Pulmonary effort is normal.  Abdominal:     Palpations: Abdomen is soft.     Tenderness: There is no abdominal tenderness.  Musculoskeletal:  General: Normal range of motion.     Cervical back: Normal range of motion and neck supple. No rigidity. No pain with movement.  Lymphadenopathy:     Cervical: No cervical adenopathy.  Skin:    General: Skin is warm and dry.     Findings: No rash.  Neurological:     Mental Status: She is alert and oriented to person, place, and time.     GCS: GCS eye subscore is 4. GCS verbal subscore is 5. GCS motor subscore is 6.     Sensory: No sensory deficit.     Motor: Motor function is intact.     Coordination: Coordination is intact. Coordination normal.     Gait: Gait normal.     Comments: Normal rapid alternating movements. Cranial nerves III-XII intact.   No pronator drift.  Psychiatric:        Speech: Speech normal.        Behavior: Behavior normal.        Thought Content: Thought content normal.     ED Results / Procedures / Treatments   Labs (all labs ordered are listed, but only abnormal results are displayed) Labs Reviewed - No data to display  EKG None  Radiology No results found.  Procedures Procedures (including critical care time)  Medications Ordered in ED Medications  sodium chloride 0.9 % bolus 1,000 mL (0 mLs Intravenous Stopped 06/30/19 1734)  prochlorperazine (COMPAZINE) injection 10 mg (10 mg Intravenous Given 06/30/19 1545)  diphenhydrAMINE (BENADRYL) injection 25 mg (25 mg Intravenous Given 06/30/19 1540)  dexamethasone (DECADRON) injection 10 mg (10 mg Intravenous Given 06/30/19 1543)    ED Course  I have reviewed the triage vital signs and the nursing notes.  Pertinent labs & imaging results that were available during my care of the patient were reviewed by me and considered in my medical decision making (see chart for details).    MDM Rules/Calculators/A&P                      Pt was given IV fluids along with migraine cocktail including compazine, benadryl and decadron.  She felt significantly improved after this tx, headache resolved, no further nausea.  She has no neuro deficits on exam, no sx to suggest meningitis, no meningismus sx.  She was prescribed phenergan for prn use if nausea returns.  Reassurance given SE of this injections should not last more than 24 hours.  Prn f/u anticipated.  Final Clinical Impression(s) / ED Diagnoses Final diagnoses:  Acute nonintractable headache, unspecified headache type    Rx / DC Orders ED Discharge Orders         Ordered    promethazine (PHENERGAN) 25 MG tablet  Every 6 hours PRN     06/30/19 1707           Burgess Amor, PA-C 07/02/19 0818    Long, Arlyss Repress, MD 07/02/19 [phone]

## 2019-12-20 ENCOUNTER — Other Ambulatory Visit: Payer: Self-pay

## 2019-12-20 DIAGNOSIS — U071 COVID-19: Secondary | ICD-10-CM

## 2019-12-20 DIAGNOSIS — Z20822 Contact with and (suspected) exposure to covid-19: Secondary | ICD-10-CM

## 2019-12-20 HISTORY — DX: COVID-19: U07.1

## 2019-12-21 LAB — NOVEL CORONAVIRUS, NAA: SARS-CoV-2, NAA: DETECTED — AB

## 2019-12-21 LAB — SARS-COV-2, NAA 2 DAY TAT

## 2019-12-22 ENCOUNTER — Encounter: Payer: Self-pay | Admitting: Nurse Practitioner

## 2019-12-22 ENCOUNTER — Telehealth: Payer: Self-pay | Admitting: Nurse Practitioner

## 2019-12-22 NOTE — Telephone Encounter (Signed)
Called patient to discuss Covid symptoms and the use of casirivimab/imdevimab, a monoclonal antibody infusion for those with mild to moderate Covid symptoms and at a high risk of hospitalization.  Pt is qualified for this infusion at the [facility] due to; Specific high risk criteria : BMI > 25.  Message left to call back our hotline [phone].  Ilyas Lipsitz, NP   

## 2019-12-29 ENCOUNTER — Other Ambulatory Visit: Payer: Self-pay

## 2019-12-29 DIAGNOSIS — Z20822 Contact with and (suspected) exposure to covid-19: Secondary | ICD-10-CM

## 2019-12-31 LAB — SARS-COV-2, NAA 2 DAY TAT

## 2019-12-31 LAB — NOVEL CORONAVIRUS, NAA

## 2020-02-17 ENCOUNTER — Telehealth (RURAL_HEALTH_CENTER): Payer: Self-pay

## 2020-02-17 DIAGNOSIS — Z1152 Encounter for screening for COVID-19: Secondary | ICD-10-CM

## 2020-02-17 NOTE — Telephone Encounter (Signed)
Dennison Nancy (nurse) from [facility] called stating patient has developed COVID symptoms and is requested an order for a PCR and Flu and would like to perform the tests at their facility.   Phone: [phone]  Phone: [phone]

## 2020-02-17 NOTE — Telephone Encounter (Signed)
-   Symptoms began: 02/16/2020  - Exposure/Date of: No exposure     Symptoms:  -Cough: Y  -Congestion: Y  -Sore Throat: Y  -Fever (how high, and has it responded to antipyretics): N  -SOB (document severity):N  -Body Aches: N  -Headache: Y  -Fatigue: N  -Loss of taste/smell: N  -GI symptoms (#of Episodes in the past 24 hours): N    Vaccinated: Yes/boosted     Order placed for PCR testing to be obtained at [facility] today between the hours of 11 AM and 4:30 PM.  Patient is aware that they will be contacted by their doctor's office with the results as they are available and in the meantime they should quarantine.  Patient is encouraged to stay well rested, well hydrated, and use Tylenol as needed for pain or fever. Patient to call their PCP for any worsening symptoms, questions, or concerns. Encouraged patient to call 911 or go to the emergency room if they develop respiratory distress or difficulty breathing.  Patient verbalizes understanding and is agreeable to this plan.

## 2020-03-09 ENCOUNTER — Emergency Department (HOSPITAL_COMMUNITY)
Admission: EM | Admit: 2020-03-09 | Discharge: 2020-03-09 | Disposition: A | Discharge status: Home / Self Care | Payer: BC Managed Care – PPO | Attending: Emergency Medicine | Admitting: Emergency Medicine

## 2020-03-09 ENCOUNTER — Encounter (HOSPITAL_COMMUNITY): Payer: Self-pay | Admitting: *Deleted

## 2020-03-09 ENCOUNTER — Emergency Department (HOSPITAL_COMMUNITY): Payer: BC Managed Care – PPO

## 2020-03-09 ENCOUNTER — Other Ambulatory Visit: Payer: Self-pay

## 2020-03-09 DIAGNOSIS — Z87891 Personal history of nicotine dependence: Secondary | ICD-10-CM | POA: Diagnosis not present

## 2020-03-09 DIAGNOSIS — R112 Nausea with vomiting, unspecified: Secondary | ICD-10-CM | POA: Diagnosis not present

## 2020-03-09 DIAGNOSIS — R1032 Left lower quadrant pain: Secondary | ICD-10-CM | POA: Diagnosis not present

## 2020-03-09 DIAGNOSIS — R1030 Lower abdominal pain, unspecified: Secondary | ICD-10-CM

## 2020-03-09 DIAGNOSIS — Z8616 Personal history of COVID-19: Secondary | ICD-10-CM | POA: Diagnosis not present

## 2020-03-09 LAB — COMPREHENSIVE METABOLIC PANEL
ALT: 29 U/L (ref 0–44)
AST: 15 U/L (ref 15–41)
Albumin: 4.4 g/dL (ref 3.5–5.0)
Alkaline Phosphatase: 47 U/L (ref 38–126)
Anion gap: 8 (ref 5–15)
BUN: 9 mg/dL (ref 6–20)
CO2: 27 mmol/L (ref 22–32)
Calcium: 9.6 mg/dL (ref 8.9–10.3)
Chloride: 102 mmol/L (ref 98–111)
Creatinine, Ser: 0.63 mg/dL (ref 0.44–1.00)
GFR, Estimated: >60 mL/min (ref >60)
Glucose, Bld: 91 mg/dL (ref 70–99)
Potassium: 3.9 mmol/L (ref 3.5–5.1)
Sodium: 137 mmol/L (ref 135–145)
Total Bilirubin: 0.3 mg/dL (ref 0.3–1.2)
Total Protein: 7.9 g/dL (ref 6.5–8.1)

## 2020-03-09 LAB — CBC
HCT: 40.9 % (ref 36.0–46.0)
Hemoglobin: 13.4 g/dL (ref 12.0–15.0)
MCH: 29.5 pg (ref 26.0–34.0)
MCHC: 32.8 g/dL (ref 30.0–36.0)
MCV: 89.9 fL (ref 80.0–100.0)
Platelets: 317 10*3/uL (ref 150–400)
RBC: 4.55 MIL/uL (ref 3.87–5.11)
RDW: 12.2 % (ref 11.5–15.5)
WBC: 7.3 10*3/uL (ref 4.0–10.5)
nRBC: 0 % (ref 0.0–0.2)

## 2020-03-09 LAB — URINALYSIS, ROUTINE W REFLEX MICROSCOPIC
Bilirubin Urine: NEGATIVE
Glucose, UA: NEGATIVE mg/dL
Hgb urine dipstick: NEGATIVE
Ketones, ur: NEGATIVE mg/dL
Leukocytes,Ua: NEGATIVE
Nitrite: NEGATIVE
Protein, ur: NEGATIVE mg/dL
Specific Gravity, Urine: 1.016 (ref 1.005–1.030)
pH: 7 (ref 5.0–8.0)

## 2020-03-09 LAB — LIPASE, BLOOD: Lipase: 30 U/L (ref 11–51)

## 2020-03-09 LAB — POC URINE PREG, ED: Preg Test, Ur: NEGATIVE

## 2020-03-09 MED ORDER — ONDANSETRON HCL 4 MG PO TABS: 4.0000 mg | ORAL_TABLET | Freq: Three times a day (TID) | ORAL | 0 refills | Status: AC | PRN

## 2020-03-09 MED ORDER — DICYCLOMINE HCL 20 MG PO TABS: 20.0000 mg | ORAL_TABLET | Freq: Two times a day (BID) | ORAL | 0 refills | Status: AC

## 2020-03-09 MED ORDER — IOHEXOL 300 MG/ML  SOLN
100.0000 mL | Freq: Once | INTRAMUSCULAR | Status: AC | PRN
  Administered 2020-03-09: 100 mL via INTRAVENOUS

## 2020-03-09 NOTE — ED Notes (Signed)
Dinner trays didn't arrive until 1852 therefore 1700 dose of meal coverage was not given during my shift. This was reported off to oncoming nurse to give meal coverage if pt were to eat 50% or more of her meal. 

## 2020-03-09 NOTE — ED Triage Notes (Signed)
Abdominal pain with vomiting x 1 week

## 2020-03-09 NOTE — ED Provider Notes (Signed)
[facility] EMERGENCY DEPARTMENT Provider Note   CSN: [id number] Arrival date & time: 03/09/20  1127    History Chief Complaint  Patient presents with  . Abdominal Pain    Madison Clay is a 25 y.o. female with past medical history significant for obesity who presents for evaluation of lower abdominal pain.  Pain began 2 weeks ago.  Intermittent in nature, localized primarily to left lower quadrant.  Initially thought was due to constipation so she took a laxative.  States she has had normal bowel movements since then.  She is not sexually active.  No pelvic pain, vaginal discharge, dysuria.  No concerns for STDs.  No flank pain bilaterally.  Did wake up in the middle the night 2 days over the last week with 1 episode of emesis.  Her pain is not worse with food intake, movement.  Rates her current pain a 2/10.  Denies additional aggravating or alleviating factors.  History obtained from patient and past medical records.  No interpreter was used.  HPI     Past Medical History:  Diagnosis Date  . ADD (attention deficit disorder)   . Allergy   . COVID-19 virus infection 12/2019  . Morbid obesity [facility])     Patient Active Problem List   Diagnosis Date Noted  . Morbid obesity (HCC)   . COVID-19 virus infection 12/2019    History reviewed. No pertinent surgical history.   OB History   No obstetric history on file.     No family history on file.  Social History   Tobacco Use  . Smoking status: Former Smoker    Types: Cigarettes  . Smokeless tobacco: Never Used  . Tobacco comment: social smoker  Substance Use Topics  . Alcohol use: Yes    Comment: mostly on weekends  . Drug use: Yes    Types: Marijuana    Home Medications Prior to Admission medications   Medication Sig Start Date End Date Taking? Authorizing Provider  naproxen (NAPROSYN) 500 MG tablet Take 1 tablet (500 mg total) by mouth 2 (two) times daily. 09/01/18  Yes Aviva Kluver B, PA-C  promethazine  (PHENERGAN) 25 MG tablet Take 1 tablet (25 mg total) by mouth every 6 (six) hours as needed for nausea or vomiting. 06/30/19   Burgess Amor, PA-C    Allergies    Patient has no known allergies.  Review of Systems   Review of Systems  Constitutional: Negative.   HENT: Negative.   Respiratory: Negative.   Cardiovascular: Negative.   Gastrointestinal: Positive for abdominal pain (Lower abdomen, LLQ) and vomiting (2 episodes, resolved). Negative for anal bleeding, blood in stool, constipation, diarrhea, nausea and rectal pain.  Genitourinary: Negative.   Musculoskeletal: Negative.   Skin: Negative.   Neurological: Negative.   All other systems reviewed and are negative.   Physical Exam Updated Vital Signs BP 119/82   Pulse 86   Temp 98.1 F (36.7 C) (Oral)   Resp 18   Ht 6\' 2"  (1.88 m)   Wt 113.4 kg   SpO2 100%   BMI 32.10 kg/m   Physical Exam Vitals and nursing note reviewed.  Constitutional:      General: She is not in acute distress.    Appearance: She is well-developed and well-nourished. She is not ill-appearing, toxic-appearing or diaphoretic.  HENT:     Head: Normocephalic and atraumatic.     Mouth/Throat:     Mouth: Mucous membranes are moist.  Eyes:     Pupils:  Pupils are equal, round, and reactive to light.  Cardiovascular:     Rate and Rhythm: Normal rate.     Pulses: Intact distal pulses.     Heart sounds: Normal heart sounds.  Pulmonary:     Effort: Pulmonary effort is normal. No respiratory distress.     Breath sounds: Normal breath sounds.  Abdominal:     General: Bowel sounds are normal. There is no distension.     Palpations: Abdomen is soft.     Tenderness: There is no abdominal tenderness. There is no right CVA tenderness, left CVA tenderness, guarding or rebound. Negative signs include McBurney's sign, psoas sign and obturator sign.     Hernia: No hernia is present.  Genitourinary:    Comments: Declined Musculoskeletal:        General: Normal  range of motion.     Cervical back: Normal range of motion.  Skin:    General: Skin is warm and dry.     Capillary Refill: Capillary refill takes less than 2 seconds.  Neurological:     General: No focal deficit present.     Mental Status: She is alert and oriented to person, place, and time.  Psychiatric:        Mood and Affect: Mood and affect normal.    ED Results / Procedures / Treatments   Labs (all labs ordered are listed, but only abnormal results are displayed) Labs Reviewed  URINALYSIS, ROUTINE W REFLEX MICROSCOPIC - Abnormal; Notable for the following components:      Result Value   APPearance HAZY (*)    All other components within normal limits  LIPASE, BLOOD  COMPREHENSIVE METABOLIC PANEL  CBC  POC URINE PREG, ED    EKG None  Radiology CT Abdomen Pelvis W Contrast  Result Date: 03/09/2020 CLINICAL DATA:  Left lower quadrant pain, nausea, constipation 1 week. EXAM: CT ABDOMEN AND PELVIS WITH CONTRAST TECHNIQUE: Multidetector CT imaging of the abdomen and pelvis was performed using the standard protocol following bolus administration of intravenous contrast. CONTRAST:  OMNIPAQUE IOHEXOL 300 MG/ML  SOLN COMPARISON:  None FINDINGS: Lower chest: No acute abnormality. Hepatobiliary: No focal liver abnormality. No gallstones, gallbladder wall thickening, or pericholecystic fluid. No biliary dilatation. Pancreas: No focal lesion. Normal pancreatic contour. No surrounding inflammatory changes. No main pancreatic ductal dilatation. Spleen: Normal in size without focal abnormality. Adrenals/Urinary Tract: No adrenal nodule bilaterally. Bilateral kidneys enhance symmetrically. No hydronephrosis. No hydroureter. The urinary bladder is unremarkable. Stomach/Bowel: Stomach is within normal limits. No evidence of bowel wall thickening or dilatation. Few scattered colonic diverticula. The appendix measures slightly enlarged in caliber bone (8 mm). No appendicolith identified. No  associated right lower quadrant inflammatory changes. Vascular/Lymphatic: No significant vascular findings are present. No enlarged abdominal or pelvic lymph nodes. Reproductive: The uterus is retroflexed. Uterus and bilateral adnexa are unremarkable. Other: No intraperitoneal free fluid. No intraperitoneal free gas. No organized fluid collection. Musculoskeletal: No acute or significant osseous findings. IMPRESSION: 1. The appendix measures at the upper limits of normal/borderline enlarged with no associated appendicoliths, no definite appendiceal wall thickening, or definite right lower quadrant inflammatory changes. Findings likely represent a normal appendix. Recommend clinical correlation. 2. Few scattered colonic diverticula with no acute diverticulitis. Electronically Signed   By: Tish Frederickson M.D.   On: 03/09/2020 18:41   Procedures Procedures (including critical care time)  Medications Ordered in ED Medications  iohexol (OMNIPAQUE) 300 MG/ML solution 100 mL (100 mLs Intravenous Contrast Given 03/09/20 1820)  ED Course  I have reviewed the triage vital signs and the nursing notes.  Pertinent labs & imaging results that were available during my care of the patient were reviewed by me and considered in my medical decision making (see chart for details).  25 year old presents for evaluation of lower abdominal pain which began 2 weeks ago.  She is afebrile, nonseptic, not ill-appearing.  She appears otherwise well.  Her pain is more so localized to her left lower quadrant.  She denies any concerns for STDs, is not sexually active.  No vaginal discharge or pelvic pain.  No urinary complaints.  States pain is intermittent in nature.  Does not radiate to the flank.  No hematuria.  Heart and lungs clear.  Abdomen soft, no focal tenderness.  Speciffically negative McBurney point, psoas, obturator sign.  Work-up started from triage which I personally reviewed and interpreted:  CBC without  leukocytosis Metabolic panel without electrolyte, renal or liver abnormality Lipase 30 Pregnancy test negative UA negative for infection CT abdomen pelvis with borderline appendix however no appendicolith, inflammatory changes, wall thickening, likely normal appendix  Patient reassessed.  Continues have nonfocal abdominal exam.  Feel patient had appendicitis we have pain more so located to umbilicus, right lower quadrant as well as fever, leukocytosis as her symptoms started 2 weeks.  Do not feel she currently has appendicitis.  Will treat symptomatically.   Patient is nontoxic, nonseptic appearing, in no apparent distress.  Patient's pain and other symptoms adequately managed in emergency department.  Fluid bolus given.  Labs, imaging and vitals reviewed.  Patient does not meet the SIRS or Sepsis criteria.  On repeat exam patient does not have a surgical abdomin and there are no peritoneal signs.  No indication of appendicitis, bowel obstruction, bowel perforation, cholecystitis, diverticulitis, PID, TOA, torsion or ectopic pregnancy.  Patient discharged home with symptomatic treatment and given strict instructions for follow-up with their primary care physician.  I have also discussed reasons to return immediately to the ER.  Patient expresses understanding and agrees with plan.      MDM Rules/Calculators/A&P                          Final Clinical Impression(s) / ED Diagnoses Final diagnoses:  None    Rx / DC Orders ED Discharge Orders    None       Levander Katzenstein A, PA-C 03/09/20 Augustine Radar, MD 03/13/20 [phone]

## 2020-03-09 NOTE — Discharge Instructions (Addendum)
Zofran for nausea Bentyl for abd pain  Return for new or worsening symptoms

## 2020-04-06 ENCOUNTER — Emergency Department (HOSPITAL_COMMUNITY)
Admission: EM | Admit: 2020-04-06 | Discharge: 2020-04-06 | Disposition: A | Discharge status: Home / Self Care | Payer: BC Managed Care – PPO | Attending: Emergency Medicine | Admitting: Emergency Medicine

## 2020-04-06 ENCOUNTER — Other Ambulatory Visit: Payer: Self-pay

## 2020-04-06 ENCOUNTER — Emergency Department (HOSPITAL_COMMUNITY): Payer: BC Managed Care – PPO

## 2020-04-06 ENCOUNTER — Encounter (HOSPITAL_COMMUNITY): Payer: Self-pay | Admitting: Emergency Medicine

## 2020-04-06 DIAGNOSIS — F12929 Cannabis use, unspecified with intoxication, unspecified: Secondary | ICD-10-CM | POA: Diagnosis not present

## 2020-04-06 DIAGNOSIS — Z8616 Personal history of COVID-19: Secondary | ICD-10-CM | POA: Insufficient documentation

## 2020-04-06 DIAGNOSIS — Z87891 Personal history of nicotine dependence: Secondary | ICD-10-CM | POA: Diagnosis not present

## 2020-04-06 DIAGNOSIS — F419 Anxiety disorder, unspecified: Secondary | ICD-10-CM | POA: Insufficient documentation

## 2020-04-06 DIAGNOSIS — R200 Anesthesia of skin: Secondary | ICD-10-CM | POA: Diagnosis present

## 2020-04-06 DIAGNOSIS — R202 Paresthesia of skin: Secondary | ICD-10-CM | POA: Diagnosis not present

## 2020-04-06 DIAGNOSIS — F12920 Cannabis use, unspecified with intoxication, uncomplicated: Secondary | ICD-10-CM

## 2020-04-06 LAB — COMPREHENSIVE METABOLIC PANEL
ALT: 25 U/L (ref 0–44)
AST: 16 U/L (ref 15–41)
Albumin: 4.8 g/dL (ref 3.5–5.0)
Alkaline Phosphatase: 52 U/L (ref 38–126)
Anion gap: 11 (ref 5–15)
BUN: 19 mg/dL (ref 6–20)
CO2: 21 mmol/L — ABNORMAL LOW (ref 22–32)
Calcium: 9.4 mg/dL (ref 8.9–10.3)
Chloride: 101 mmol/L (ref 98–111)
Creatinine, Ser: 0.69 mg/dL (ref 0.44–1.00)
GFR, Estimated: >60 mL/min (ref >60)
Glucose, Bld: 143 mg/dL — ABNORMAL HIGH (ref 70–99)
Potassium: 2.9 mmol/L — ABNORMAL LOW (ref 3.5–5.1)
Sodium: 133 mmol/L — ABNORMAL LOW (ref 135–145)
Total Bilirubin: 0.4 mg/dL (ref 0.3–1.2)
Total Protein: 7.8 g/dL (ref 6.5–8.1)

## 2020-04-06 LAB — CBC WITH DIFFERENTIAL/PLATELET
Abs Immature Granulocytes: 0.03 10*3/uL (ref 0.00–0.07)
Basophils Absolute: 0.1 10*3/uL (ref 0.0–0.1)
Basophils Relative: 1 %
Eosinophils Absolute: 0.2 10*3/uL (ref 0.0–0.5)
Eosinophils Relative: 2 %
HCT: 40.2 % (ref 36.0–46.0)
Hemoglobin: 13.3 g/dL (ref 12.0–15.0)
Immature Granulocytes: 0 %
Lymphocytes Relative: 40 %
Lymphs Abs: 5.2 10*3/uL — ABNORMAL HIGH (ref 0.7–4.0)
MCH: 29.2 pg (ref 26.0–34.0)
MCHC: 33.1 g/dL (ref 30.0–36.0)
MCV: 88.2 fL (ref 80.0–100.0)
Monocytes Absolute: 0.8 10*3/uL (ref 0.1–1.0)
Monocytes Relative: 6 %
Neutro Abs: 6.8 10*3/uL (ref 1.7–7.7)
Neutrophils Relative %: 51 %
Platelets: 349 10*3/uL (ref 150–400)
RBC: 4.56 MIL/uL (ref 3.87–5.11)
RDW: 12.3 % (ref 11.5–15.5)
WBC: 13.1 10*3/uL — ABNORMAL HIGH (ref 4.0–10.5)
nRBC: 0 % (ref 0.0–0.2)

## 2020-04-06 LAB — RAPID URINE DRUG SCREEN, HOSP PERFORMED
Amphetamines: NOT DETECTED
Barbiturates: NOT DETECTED
Benzodiazepines: NOT DETECTED
Cocaine: NOT DETECTED
Opiates: NOT DETECTED
Tetrahydrocannabinol: POSITIVE — AB

## 2020-04-06 LAB — ETHANOL: Alcohol, Ethyl (B): <10 mg/dL (ref <10)

## 2020-04-06 MED ORDER — POTASSIUM CHLORIDE CRYS ER 20 MEQ PO TBCR
40.0000 meq | EXTENDED_RELEASE_TABLET | Freq: Once | ORAL | Status: AC
  Administered 2020-04-06: 40 meq via ORAL
  Filled 2020-04-06: qty 2

## 2020-04-06 NOTE — ED Provider Notes (Signed)
[facility] EMERGENCY DEPARTMENT Provider Note   CSN: [id number] Arrival date & time: 04/06/20  2012     History Chief Complaint  Patient presents with  . Numbness    Madison Clay is a 25 y.o. female.  The history is provided by the patient. No language interpreter was used.  Anxiety This is a new problem. The current episode started 3 to 5 hours ago. The problem occurs constantly. Nothing aggravates the symptoms. Nothing relieves the symptoms. She has tried nothing for the symptoms. The treatment provided no relief.   Pt reports she felt like she ws having a panic attack.  Pt reports she smoked marijuana and symptoms became worse.  Pt states she face was weak on the left side.      Past Medical History:  Diagnosis Date  . ADD (attention deficit disorder)   . Allergy   . COVID-19 virus infection 12/2019  . Morbid obesity [facility])     Patient Active Problem List   Diagnosis Date Noted  . Morbid obesity (HCC)   . COVID-19 virus infection 12/2019    History reviewed. No pertinent surgical history.   OB History   No obstetric history on file.     History reviewed. No pertinent family history.  Social History   Tobacco Use  . Smoking status: Former Smoker    Types: Cigarettes  . Smokeless tobacco: Never Used  . Tobacco comment: social smoker  Substance Use Topics  . Alcohol use: Yes    Comment: mostly on weekends  . Drug use: Yes    Types: Marijuana    Home Medications Prior to Admission medications   Medication Sig Start Date End Date Taking? Authorizing Provider  dicyclomine (BENTYL) 20 MG tablet Take 1 tablet (20 mg total) by mouth 2 (two) times daily. 03/09/20   Henderly, Britni A, PA-C  naproxen (NAPROSYN) 500 MG tablet Take 1 tablet (500 mg total) by mouth 2 (two) times daily. 09/01/18   Aviva Kluver B, PA-C  ondansetron (ZOFRAN) 4 MG tablet Take 1 tablet (4 mg total) by mouth every 8 (eight) hours as needed for nausea or vomiting. 03/09/20    Henderly, Britni A, PA-C  promethazine (PHENERGAN) 25 MG tablet Take 1 tablet (25 mg total) by mouth every 6 (six) hours as needed for nausea or vomiting. 06/30/19   Burgess Amor, PA-C    Allergies    Patient has no known allergies.  Review of Systems   Review of Systems  All other systems reviewed and are negative.   Physical Exam Updated Vital Signs BP 130/78   Pulse (!) 102   Temp 98.3 F (36.8 C)   Resp 17   Ht 6\' 2"  (1.88 m)   Wt 113.4 kg   SpO2 100%   BMI 32.10 kg/m   Physical Exam Vitals and nursing note reviewed.  Constitutional:      Appearance: She is well-developed and well-nourished.  HENT:     Head: Normocephalic.     Nose: Nose normal.     Mouth/Throat:     Mouth: Mucous membranes are moist.  Eyes:     Extraocular Movements: Extraocular movements intact and EOM normal.     Pupils: Pupils are equal, round, and reactive to light.  Cardiovascular:     Rate and Rhythm: Tachycardia present.  Pulmonary:     Effort: Pulmonary effort is normal.  Abdominal:     General: There is no distension.  Musculoskeletal:  General: Normal range of motion.     Cervical back: Normal range of motion.     Comments: Pt flipping left hand over repetively,    Skin:    General: Skin is warm.  Neurological:     General: No focal deficit present.     Mental Status: She is alert and oriented to person, place, and time.     Comments: Garbled speech,   Psychiatric:        Mood and Affect: Mood and affect and mood normal.     ED Results / Procedures / Treatments   Labs (all labs ordered are listed, but only abnormal results are displayed) Labs Reviewed  CBC WITH DIFFERENTIAL/PLATELET - Abnormal; Notable for the following components:      Result Value   WBC 13.1 (*)    Lymphs Abs 5.2 (*)    All other components within normal limits  COMPREHENSIVE METABOLIC PANEL - Abnormal; Notable for the following components:   Sodium 133 (*)    Potassium 2.9 (*)    CO2 21 (*)     Glucose, Bld 143 (*)    All other components within normal limits  RAPID URINE DRUG SCREEN, HOSP PERFORMED - Abnormal; Notable for the following components:   Tetrahydrocannabinol POSITIVE (*)    All other components within normal limits  ETHANOL  POC URINE PREG, ED    EKG EKG Interpretation  Date/Time:  Thursday April 06 2020 20:24:10 EST Ventricular Rate:  141 PR Interval:    QRS Duration: 96 QT Interval:  260 QTC Calculation: 399 R Axis:   73 Text Interpretation: Sinus tachycardia Low voltage, precordial leads RSR' in V1 or V2, right VCD or RVH No previous ECGs available Confirmed by Vanetta Mulders [phone]) on 04/06/2020 8:27:35 PM   Radiology CT Head Wo Contrast  Result Date: 04/06/2020 CLINICAL DATA:  Altered mental status with questionable seizure. Left-sided facial droop EXAM: CT HEAD WITHOUT CONTRAST TECHNIQUE: Contiguous axial images were obtained from the base of the skull through the vertex without intravenous contrast. COMPARISON:  None. FINDINGS: Brain: Ventricles and sulci are normal in size and configuration. There is no intracranial mass, hemorrhage, extra-axial fluid collection, or midline shift. The brain parenchyma appears unremarkable. No evident acute infarct. Vascular: No hyperdense vessel.  No evident vascular calcification. Skull: Bony calvarium appears intact. Sinuses/Orbits: Visualized paranasal sinuses are clear. Visualized orbits appear symmetric bilaterally. Other: Visualized mastoid air cells are clear. IMPRESSION: Study within normal limits. Electronically Signed   By: Bretta Bang III M.D.   On: 04/06/2020 21:36    Procedures Procedures   Medications Ordered in ED Medications - No data to display  ED Course  I have reviewed the triage vital signs and the nursing notes.  Pertinent labs & imaging results that were available during my care of the patient were reviewed by me and considered in my medical decision making (see chart for  details).    MDM Rules/Calculators/A&P                          MDM:  Ct head and labs reviewed.  Pt reexamined no neuro deficits.  Pt advised multi vitamin due to low potassium  Avoid marijuana  Final Clinical Impression(s) / ED Diagnoses Final diagnoses:  Paresthesias  Anxiety  Cannabis intoxication without complication (HCC)    Rx / DC Orders ED Discharge Orders    None    An After Visit Summary was printed and given to  the patient.    Elson Areas, Cordelia Poche 04/06/20 2257    Vanetta Mulders, MD 04/18/20 1745

## 2020-04-06 NOTE — ED Triage Notes (Signed)
Pt brought in by brother for possible seizure. Pt having left sided facial droop and shaking bilateral. Pt admits to smoking weed this afternoon then having a panic attack.

## 2020-04-06 NOTE — Discharge Instructions (Addendum)
Take a multivitamin daily.  Your potassium was low today.  See your Physician for recheck

## 2020-05-10 ENCOUNTER — Other Ambulatory Visit (HOSPITAL_COMMUNITY): Payer: Self-pay | Admitting: Family Medicine

## 2020-05-10 DIAGNOSIS — Z823 Family history of stroke: Secondary | ICD-10-CM

## 2020-05-25 ENCOUNTER — Ambulatory Visit (HOSPITAL_COMMUNITY)
Admission: RE | Admit: 2020-05-25 | Discharge: 2020-05-25 | Disposition: A | Discharge status: Home / Self Care | Payer: Self-pay | Source: Ambulatory Visit | Attending: Family Medicine | Admitting: Family Medicine

## 2020-05-25 DIAGNOSIS — Z823 Family history of stroke: Secondary | ICD-10-CM | POA: Insufficient documentation

## 2020-05-25 MED ORDER — GADOBUTROL 1 MMOL/ML IV SOLN
10.0000 mL | Freq: Once | INTRAVENOUS | Status: AC | PRN
  Administered 2020-05-25: 10 mL via INTRAVENOUS

## 2020-07-31 ENCOUNTER — Ambulatory Visit
Admission: EM | Admit: 2020-07-31 | Discharge: 2020-07-31 | Disposition: A | Discharge status: Home / Self Care | Payer: Self-pay | Attending: Family Medicine | Admitting: Family Medicine

## 2020-07-31 ENCOUNTER — Other Ambulatory Visit: Payer: Self-pay

## 2020-07-31 ENCOUNTER — Encounter: Payer: Self-pay | Admitting: Emergency Medicine

## 2020-07-31 DIAGNOSIS — J22 Unspecified acute lower respiratory infection: Secondary | ICD-10-CM

## 2020-07-31 DIAGNOSIS — Z20822 Contact with and (suspected) exposure to covid-19: Secondary | ICD-10-CM

## 2020-07-31 DIAGNOSIS — H9203 Otalgia, bilateral: Secondary | ICD-10-CM

## 2020-07-31 MED ORDER — PROMETHAZINE-DM 6.25-15 MG/5ML PO SYRP: 5.0000 mL | ORAL_SOLUTION | Freq: Four times a day (QID) | ORAL | 0 refills | Status: AC | PRN

## 2020-07-31 MED ORDER — PREDNISONE 20 MG PO TABS: 40.0000 mg | ORAL_TABLET | Freq: Every day | ORAL | 0 refills | Status: DC

## 2020-07-31 NOTE — Discharge Instructions (Addendum)
Your COVID 19 results should result within 3-5 days. °Negative results are immediately resulted to Mychart.  ° °Positive results will receive a follow-up call from our clinic. If symptoms are present, I recommend home quarantine until results are known.  ° °Alternate Tylenol and ibuprofen as needed for body aches and fever.  Symptom management per recommendations discussed today.  If any breathing difficulty or chest pain develops go immediately to the closest emergency department for evaluation.  °

## 2020-07-31 NOTE — ED Triage Notes (Signed)
Attended a wedding on 6/11.  That night sore throat and fever started.  Nasal congestion with nausea, ear pain

## 2020-08-02 LAB — COVID-19, FLU A+B NAA
Influenza A, NAA: NOT DETECTED
Influenza B, NAA: NOT DETECTED
SARS-CoV-2, NAA: NOT DETECTED

## 2020-09-29 ENCOUNTER — Other Ambulatory Visit: Payer: Self-pay

## 2020-09-29 ENCOUNTER — Encounter: Payer: Self-pay | Admitting: Emergency Medicine

## 2020-09-29 ENCOUNTER — Ambulatory Visit
Admission: EM | Admit: 2020-09-29 | Discharge: 2020-09-29 | Disposition: A | Discharge status: Home / Self Care | Payer: Self-pay | Attending: Emergency Medicine | Admitting: Emergency Medicine

## 2020-09-29 DIAGNOSIS — R1012 Left upper quadrant pain: Secondary | ICD-10-CM

## 2020-09-29 LAB — POCT URINALYSIS DIP (MANUAL ENTRY)
Bilirubin, UA: NEGATIVE
Blood, UA: NEGATIVE
Glucose, UA: NEGATIVE mg/dL
Leukocytes, UA: NEGATIVE
Nitrite, UA: NEGATIVE
Protein Ur, POC: NEGATIVE mg/dL
Spec Grav, UA: >=1.03 — AB (ref 1.010–1.025)
Urobilinogen, UA: 0.2 E.U./dL
pH, UA: 5.5 (ref 5.0–8.0)

## 2020-09-29 MED ORDER — ALUM & MAG HYDROXIDE-SIMETH 200-200-20 MG/5ML PO SUSP
30.0000 mL | Freq: Once | ORAL | Status: AC
  Administered 2020-09-29: 30 mL via ORAL

## 2020-09-29 MED ORDER — LIDOCAINE VISCOUS HCL 2 % MT SOLN
15.0000 mL | Freq: Once | OROMUCOSAL | Status: AC
  Administered 2020-09-29: 15 mL via ORAL

## 2020-09-29 NOTE — Discharge Instructions (Signed)
Unable to determine cause of LUQ pain in urgent care setting.  Offered patient further evaluation and management in the ED.  Patient declines at this time and would like to try outpatient therapy first.  Aware of the risk associated with this decision including missed diagnosis, organ damage, organ failure, and/or death.  Patient aware and in agreement.     Urine without infection, slight dehydration GI cocktail given in office Follow up with PCP If you experience new or worsening symptoms return or go to ER such as fever, chills, nausea, vomiting, diarrhea, bloody or dark tarry stools, constipation, urinary symptoms, worsening abdominal discomfort, symptoms that do not improve with medications, inability to keep fluids down, etc..Marland Kitchen

## 2020-09-29 NOTE — ED Triage Notes (Signed)
Hx of abd pain.  States she has had this for over a year.  States pain has become worse today.  Pain in left side of ABD under rib area.  States it hurts to have a BM and feels pain in that area.

## 2020-09-29 NOTE — ED Provider Notes (Signed)
[facility]   [id number] 09/29/20 Arrival Time: 1513  CC: ABDOMINAL DISCOMFORT  SUBJECTIVE:  Madison Clay is a 25 y.o. adult who presents with complaint of abdominal discomfort that began acute on chronic LUQ pain.  Denies a precipitating event, trauma, close contacts with similar symptoms, recent travel or antibiotic use.  Localizes pain to LUQ.  Describes as improving, intermittent and throbbing in character.  Has not tried OTC medications.  Worse with movement and using the restroom.  Last BM this morning.  Complains of nausea, vomiting, dysuria.    Denies fever, chills, chest pain, SOB, diarrhea, constipation, hematochezia, melena, difficulty urinating, increased frequency or urgency, flank pain, loss of bowel or bladder function, vaginal discharge.    No LMP recorded. (Menstrual status: Irregular Periods).  ROS: As per HPI.  All other pertinent ROS negative.     Past Medical History:  Diagnosis Date   ADD (attention deficit disorder)    Allergy    COVID-19 virus infection 12/2019   Morbid obesity (HCC)    History reviewed. No pertinent surgical history. No Known Allergies No current facility-administered medications on file prior to encounter.   Current Outpatient Medications on File Prior to Encounter  Medication Sig Dispense Refill   dicyclomine (BENTYL) 20 MG tablet Take 1 tablet (20 mg total) by mouth 2 (two) times daily. 20 tablet 0   naproxen (NAPROSYN) 500 MG tablet Take 1 tablet (500 mg total) by mouth 2 (two) times daily. 30 tablet 0   ondansetron (ZOFRAN) 4 MG tablet Take 1 tablet (4 mg total) by mouth every 8 (eight) hours as needed for nausea or vomiting. 4 tablet 0   predniSONE (DELTASONE) 20 MG tablet Take 2 tablets (40 mg total) by mouth daily with breakfast. 10 tablet 0   promethazine (PHENERGAN) 25 MG tablet Take 1 tablet (25 mg total) by mouth every 6 (six) hours as needed for nausea or vomiting. 10 tablet 0   promethazine-dextromethorphan  (PROMETHAZINE-DM) 6.25-15 MG/5ML syrup Take 5 mLs by mouth 4 (four) times daily as needed for cough. 140 mL 0   Social History   Socioeconomic History   Marital status: Single    Spouse name: Not on file   Number of children: Not on file   Years of education: Not on file   Highest education level: Not on file  Occupational History   Not on file  Tobacco Use   Smoking status: Former    Types: Cigarettes   Smokeless tobacco: Never   Tobacco comments:    social smoker  Substance and Sexual Activity   Alcohol use: Yes    Comment: mostly on weekends   Drug use: Yes    Types: Marijuana   Sexual activity: Not on file  Other Topics Concern   Not on file  Social History Narrative   Not on file   Social Determinants of Health   Financial Resource Strain: Not on file  Food Insecurity: Not on file  Transportation Needs: Not on file  Physical Activity: Not on file  Stress: Not on file  Social Connections: Not on file  Intimate Partner Violence: Not on file   Family History  Problem Relation Age of Onset   Stroke Mother    Hernia Father      OBJECTIVE:  Vitals:   09/29/20 1550  BP: 121/78  Pulse: 77  Resp: 16  Temp: 98.3 F (36.8 C)  TempSrc: Oral  SpO2: 98%    General appearance: Alert; NAD HEENT: NCAT.  Oropharynx clear.  Lungs: clear to auscultation bilaterally without adventitious breath sounds Heart: regular rate and rhythm.   Abdomen: soft, non-distended; normal active bowel sounds; TT deep palpation over LUQ; nontender at McBurney's point; +  Murphy's sign; some  guarding Back: no CVA tenderness Extremities: no edema; symmetrical with no gross deformities Skin: warm and dry Neurologic: normal gait Psychological: alert and cooperative; normal mood and affect  LABS: Results for orders placed or performed during the hospital encounter of 09/29/20 (from the past 24 hour(s))  POCT urinalysis dipstick     Status: Abnormal   Collection Time: 09/29/20  4:34 PM   Result Value Ref Range   Color, UA yellow yellow   Clarity, UA cloudy (A) clear   Glucose, UA negative negative mg/dL   Bilirubin, UA negative negative   Ketones, POC UA small (15) (A) negative mg/dL   Spec Grav, UA >=5.093 (A) 1.010 - 1.025   Blood, UA negative negative   pH, UA 5.5 5.0 - 8.0   Protein Ur, POC negative negative mg/dL   Urobilinogen, UA 0.2 0.2 or 1.0 E.U./dL   Nitrite, UA Negative Negative   Leukocytes, UA Negative Negative    ASSESSMENT & PLAN:  1. LUQ abdominal pain     Meds ordered this encounter  Medications   AND Linked Order Group    alum & mag hydroxide-simeth (MAALOX/MYLANTA) 200-200-20 MG/5ML suspension 30 mL    lidocaine (XYLOCAINE) 2 % viscous mouth solution 15 mL    Unable to determine cause of LUQ pain in urgent care setting.  Offered patient further evaluation and management in the ED.  Patient declines at this time and would like to try outpatient therapy first.  Aware of the risk associated with this decision including missed diagnosis, organ damage, organ failure, and/or death.  Patient aware and in agreement.     Urine without infection, slight dehydration GI cocktail given in office Follow up with PCP If you experience new or worsening symptoms return or go to ER such as fever, chills, nausea, vomiting, diarrhea, bloody or dark tarry stools, constipation, urinary symptoms, worsening abdominal discomfort, symptoms that do not improve with medications, inability to keep fluids down, etc...  Reviewed expectations re: course of current medical issues. Questions answered. Outlined signs and symptoms indicating need for more acute intervention. Patient verbalized understanding. After Visit Summary given.   Rennis Harding, PA-C 09/29/20 1643

## 2020-10-10 ENCOUNTER — Observation Stay (HOSPITAL_COMMUNITY): Payer: 59

## 2020-10-10 ENCOUNTER — Observation Stay (HOSPITAL_COMMUNITY)
Admission: EM | Admit: 2020-10-10 | Discharge: 2020-10-11 | Disposition: A | Discharge status: Home / Self Care | Payer: 59 | Attending: Internal Medicine | Admitting: Internal Medicine

## 2020-10-10 ENCOUNTER — Other Ambulatory Visit: Payer: Self-pay

## 2020-10-10 ENCOUNTER — Observation Stay (HOSPITAL_COMMUNITY)
Admit: 2020-10-10 | Discharge: 2020-10-10 | Disposition: A | Discharge status: Home / Self Care | Payer: 59 | Attending: Family Medicine | Admitting: Family Medicine

## 2020-10-10 ENCOUNTER — Emergency Department (HOSPITAL_COMMUNITY): Payer: 59

## 2020-10-10 ENCOUNTER — Encounter (HOSPITAL_COMMUNITY): Payer: Self-pay | Admitting: Emergency Medicine

## 2020-10-10 DIAGNOSIS — R109 Unspecified abdominal pain: Secondary | ICD-10-CM | POA: Insufficient documentation

## 2020-10-10 DIAGNOSIS — R531 Weakness: Secondary | ICD-10-CM | POA: Diagnosis present

## 2020-10-10 DIAGNOSIS — Y900 Blood alcohol level of less than 20 mg/100 ml: Secondary | ICD-10-CM | POA: Insufficient documentation

## 2020-10-10 DIAGNOSIS — D72829 Elevated white blood cell count, unspecified: Secondary | ICD-10-CM

## 2020-10-10 DIAGNOSIS — R4182 Altered mental status, unspecified: Secondary | ICD-10-CM | POA: Diagnosis not present

## 2020-10-10 DIAGNOSIS — E876 Hypokalemia: Secondary | ICD-10-CM | POA: Diagnosis present

## 2020-10-10 DIAGNOSIS — E872 Acidosis, unspecified: Secondary | ICD-10-CM | POA: Diagnosis present

## 2020-10-10 DIAGNOSIS — Z20822 Contact with and (suspected) exposure to covid-19: Secondary | ICD-10-CM | POA: Insufficient documentation

## 2020-10-10 DIAGNOSIS — Z79899 Other long term (current) drug therapy: Secondary | ICD-10-CM | POA: Diagnosis not present

## 2020-10-10 DIAGNOSIS — G9341 Metabolic encephalopathy: Principal | ICD-10-CM | POA: Diagnosis present

## 2020-10-10 DIAGNOSIS — Z87891 Personal history of nicotine dependence: Secondary | ICD-10-CM | POA: Insufficient documentation

## 2020-10-10 DIAGNOSIS — Z8616 Personal history of COVID-19: Secondary | ICD-10-CM | POA: Diagnosis not present

## 2020-10-10 DIAGNOSIS — N179 Acute kidney failure, unspecified: Secondary | ICD-10-CM | POA: Diagnosis not present

## 2020-10-10 DIAGNOSIS — R569 Unspecified convulsions: Secondary | ICD-10-CM

## 2020-10-10 LAB — URINALYSIS, ROUTINE W REFLEX MICROSCOPIC
Bacteria, UA: NONE SEEN
Bilirubin Urine: NEGATIVE
Glucose, UA: NEGATIVE mg/dL
Ketones, ur: 5 mg/dL — AB
Nitrite: NEGATIVE
Protein, ur: 30 mg/dL — AB
Specific Gravity, Urine: 1.04 — ABNORMAL HIGH (ref 1.005–1.030)
pH: 6 (ref 5.0–8.0)

## 2020-10-10 LAB — RAPID URINE DRUG SCREEN, HOSP PERFORMED
Amphetamines: NOT DETECTED
Barbiturates: NOT DETECTED
Benzodiazepines: POSITIVE — AB
Cocaine: NOT DETECTED
Opiates: NOT DETECTED
Tetrahydrocannabinol: POSITIVE — AB

## 2020-10-10 LAB — CBC WITH DIFFERENTIAL/PLATELET
Abs Immature Granulocytes: 0.03 10*3/uL (ref 0.00–0.07)
Basophils Absolute: 0.1 10*3/uL (ref 0.0–0.1)
Basophils Relative: 1 %
Eosinophils Absolute: 0.2 10*3/uL (ref 0.0–0.5)
Eosinophils Relative: 1 %
HCT: 41.3 % (ref 36.0–46.0)
Hemoglobin: 14.1 g/dL (ref 12.0–15.0)
Immature Granulocytes: 0 %
Lymphocytes Relative: 26 %
Lymphs Abs: 3.6 10*3/uL (ref 0.7–4.0)
MCH: 30.2 pg (ref 26.0–34.0)
MCHC: 34.1 g/dL (ref 30.0–36.0)
MCV: 88.4 fL (ref 80.0–100.0)
Monocytes Absolute: 0.8 10*3/uL (ref 0.1–1.0)
Monocytes Relative: 6 %
Neutro Abs: 9 10*3/uL — ABNORMAL HIGH (ref 1.7–7.7)
Neutrophils Relative %: 66 %
Platelets: 368 10*3/uL (ref 150–400)
RBC: 4.67 MIL/uL (ref 3.87–5.11)
RDW: 12.6 % (ref 11.5–15.5)
WBC: 13.7 10*3/uL — ABNORMAL HIGH (ref 4.0–10.5)
nRBC: 0 % (ref 0.0–0.2)

## 2020-10-10 LAB — COMPREHENSIVE METABOLIC PANEL
ALT: 26 U/L (ref 0–44)
AST: 23 U/L (ref 15–41)
Albumin: 4.7 g/dL (ref 3.5–5.0)
Alkaline Phosphatase: 53 U/L (ref 38–126)
Anion gap: 13 (ref 5–15)
BUN: 11 mg/dL (ref 6–20)
CO2: 16 mmol/L — ABNORMAL LOW (ref 22–32)
Calcium: 9.1 mg/dL (ref 8.9–10.3)
Chloride: 107 mmol/L (ref 98–111)
Creatinine, Ser: 1.02 mg/dL — ABNORMAL HIGH (ref 0.44–1.00)
GFR, Estimated: >60 mL/min (ref >60)
Glucose, Bld: 172 mg/dL — ABNORMAL HIGH (ref 70–99)
Potassium: 2.8 mmol/L — ABNORMAL LOW (ref 3.5–5.1)
Sodium: 136 mmol/L (ref 135–145)
Total Bilirubin: 0.4 mg/dL (ref 0.3–1.2)
Total Protein: 7.9 g/dL (ref 6.5–8.1)

## 2020-10-10 LAB — ACETAMINOPHEN LEVEL: Acetaminophen (Tylenol), Serum: <10 ug/mL — ABNORMAL LOW (ref 10–30)

## 2020-10-10 LAB — LACTIC ACID, PLASMA
Lactic Acid, Venous: 5.6 mmol/L (ref 0.5–1.9)
Lactic Acid, Venous: 3.1 mmol/L (ref 0.5–1.9)
Lactic Acid, Venous: 1.1 mmol/L (ref 0.5–1.9)

## 2020-10-10 LAB — I-STAT BETA HCG BLOOD, ED (MC, WL, AP ONLY): I-stat hCG, quantitative: <5 m[IU]/mL (ref <5)

## 2020-10-10 LAB — LIPASE, BLOOD: Lipase: 28 U/L (ref 11–51)

## 2020-10-10 LAB — ETHANOL: Alcohol, Ethyl (B): <10 mg/dL (ref <10)

## 2020-10-10 LAB — SALICYLATE LEVEL: Salicylate Lvl: <7 mg/dL — ABNORMAL LOW (ref 7.0–30.0)

## 2020-10-10 LAB — RESP PANEL BY RT-PCR (FLU A&B, COVID) ARPGX2
Influenza A by PCR: NEGATIVE
Influenza B by PCR: NEGATIVE
SARS Coronavirus 2 by RT PCR: NEGATIVE

## 2020-10-10 LAB — CBG MONITORING, ED: Glucose-Capillary: 149 mg/dL — ABNORMAL HIGH (ref 70–99)

## 2020-10-10 MED ORDER — ACETAMINOPHEN 325 MG PO TABS
650.0000 mg | ORAL_TABLET | ORAL | Status: DC | PRN
  Administered 2020-10-10: 650 mg via ORAL
  Filled 2020-10-10: qty 2

## 2020-10-10 MED ORDER — SODIUM CHLORIDE 0.9 % IV BOLUS
1000.0000 mL | Freq: Once | INTRAVENOUS | Status: AC
  Administered 2020-10-10: 1000 mL via INTRAVENOUS

## 2020-10-10 MED ORDER — ACETAMINOPHEN 650 MG RE SUPP: 650.0000 mg | RECTAL | Status: DC | PRN

## 2020-10-10 MED ORDER — LORAZEPAM 2 MG/ML IJ SOLN
1.0000 mg | INTRAMUSCULAR | Status: DC | PRN
Start: 2020-10-10 — End: 2020-10-11

## 2020-10-10 MED ORDER — ACETAMINOPHEN 160 MG/5ML PO SOLN: 650.0000 mg | ORAL | Status: DC | PRN

## 2020-10-10 MED ORDER — IOHEXOL 300 MG/ML  SOLN
100.0000 mL | Freq: Once | INTRAMUSCULAR | Status: AC | PRN
  Administered 2020-10-10: 100 mL via INTRAVENOUS

## 2020-10-10 MED ORDER — STROKE: EARLY STAGES OF RECOVERY BOOK: Freq: Once | Status: AC

## 2020-10-10 MED ORDER — POTASSIUM CHLORIDE 20 MEQ PO PACK
40.0000 meq | PACK | Freq: Three times a day (TID) | ORAL | Status: AC
  Administered 2020-10-10 (×3): 40 meq via ORAL
  Filled 2020-10-10 (×3): qty 2

## 2020-10-10 MED ORDER — MAGNESIUM SULFATE 2 GM/50ML IV SOLN
2.0000 g | Freq: Once | INTRAVENOUS | Status: AC
  Administered 2020-10-10: 2 g via INTRAVENOUS
  Filled 2020-10-10: qty 50

## 2020-10-10 MED ORDER — LORAZEPAM 2 MG/ML IJ SOLN
1.0000 mg | Freq: Once | INTRAMUSCULAR | Status: AC
  Administered 2020-10-10: 1 mg via INTRAVENOUS
  Filled 2020-10-10: qty 1

## 2020-10-10 MED ORDER — PANTOPRAZOLE SODIUM 40 MG PO TBEC
40.0000 mg | DELAYED_RELEASE_TABLET | Freq: Every day | ORAL | Status: DC
  Administered 2020-10-10 – 2020-10-11 (×2): 40 mg via ORAL
  Filled 2020-10-10 (×2): qty 1

## 2020-10-10 MED ORDER — HEPARIN SODIUM (PORCINE) 5000 UNIT/ML IJ SOLN
5000.0000 [IU] | Freq: Three times a day (TID) | INTRAMUSCULAR | Status: DC
  Administered 2020-10-10: 5000 [IU] via SUBCUTANEOUS
  Filled 2020-10-10 (×2): qty 1

## 2020-10-10 MED ORDER — CALCIUM CARBONATE ANTACID 500 MG PO CHEW
1.0000 | CHEWABLE_TABLET | Freq: Three times a day (TID) | ORAL | Status: DC | PRN
  Administered 2020-10-10: 200 mg via ORAL
  Filled 2020-10-10: qty 1

## 2020-10-10 NOTE — ED Triage Notes (Signed)
Pt called 911 for heart racing. Pt had multiple episodes of unresponsive or seizure like activity for EMS.

## 2020-10-10 NOTE — ED Provider Notes (Signed)
[facility] EMERGENCY DEPARTMENT Provider Note   CSN: [id number] Arrival date & time: 10/10/20  0144     History Chief Complaint  Patient presents with   Chest Pain    Madison Clay is a 25 y.o. adult.  Patient is a 25 year old female with past medical history of ADHD, obesity.  Patient presenting by EMS for evaluation of altered mental status.  According to EMS, patient was face timing with her mother when she began having left-sided weakness and episodes of unresponsiveness/shaking of her left arm and leg.  Patient not able to add much history secondary to mental status.  She also complained of pain to the left upper abdomen.  From reviewing the chart, it seems though she has been having this for several weeks.  She had 1 prior visit to urgent care, however no definitive cause was found and she declined transfer to the emergency department at that time.  The history is provided by the patient and the EMS personnel.      Past Medical History:  Diagnosis Date   ADD (attention deficit disorder)    Allergy    COVID-19 virus infection 12/2019   Morbid obesity [facility])     Patient Active Problem List   Diagnosis Date Noted   Morbid obesity (HCC)    COVID-19 virus infection 12/2019    History reviewed. No pertinent surgical history.   OB History   No obstetric history on file.     Family History  Problem Relation Age of Onset   Stroke Mother    Hernia Father     Social History   Tobacco Use   Smoking status: Former    Types: Cigarettes   Smokeless tobacco: Never   Tobacco comments:    social smoker  Substance Use Topics   Alcohol use: Yes    Comment: mostly on weekends   Drug use: Yes    Types: Marijuana    Home Medications Prior to Admission medications   Medication Sig Start Date End Date Taking? Authorizing Provider  dicyclomine (BENTYL) 20 MG tablet Take 1 tablet (20 mg total) by mouth 2 (two) times daily. 03/09/20   Henderly, Britni A, PA-C   naproxen (NAPROSYN) 500 MG tablet Take 1 tablet (500 mg total) by mouth 2 (two) times daily. 09/01/18   Aviva Kluver B, PA-C  ondansetron (ZOFRAN) 4 MG tablet Take 1 tablet (4 mg total) by mouth every 8 (eight) hours as needed for nausea or vomiting. 03/09/20   Henderly, Britni A, PA-C  promethazine (PHENERGAN) 25 MG tablet Take 1 tablet (25 mg total) by mouth every 6 (six) hours as needed for nausea or vomiting. 06/30/19   Idol, Raynelle Fanning, PA-C  promethazine-dextromethorphan (PROMETHAZINE-DM) 6.25-15 MG/5ML syrup Take 5 mLs by mouth 4 (four) times daily as needed for cough. 07/31/20   Bing Neighbors, FNP    Allergies    Patient has no known allergies.  Review of Systems   Review of Systems  Unable to perform ROS: Mental status change   Physical Exam Updated Vital Signs Ht 6\' 2"  (1.88 m)   Wt 113.4 kg   BMI 32.10 kg/m   Physical Exam Vitals and nursing note reviewed.  Constitutional:      General: Madison Clay is not in acute distress.    Appearance: Madison Clay is well-developed. Madison Clay is not diaphoretic.     Comments: Patient is awake and alert.  She is looking at the exam room and will give  short answers to questions.    HENT:     Head: Normocephalic and atraumatic.  Cardiovascular:     Rate and Rhythm: Normal rate and regular rhythm.     Heart sounds: No murmur heard.   No friction rub. No gallop.  Pulmonary:     Effort: Pulmonary effort is normal. No respiratory distress.     Breath sounds: Normal breath sounds. No wheezing.  Abdominal:     General: Bowel sounds are normal. There is no distension.     Palpations: Abdomen is soft.     Tenderness: There is no abdominal tenderness.  Musculoskeletal:        General: Normal range of motion.     Cervical back: Normal range of motion and neck supple.  Skin:    General: Skin is warm and dry.  Neurological:     General: No focal deficit present.     Mental Status: Madison Clay is alert and  oriented to person, place, and time.     Comments: Patient is awake and alert.  She is moving all extremities with purpose with occasional jerking movements of her left arm and leg.  She is staring around the room and will respond to questions with short answers.  She is able to follow commands.    ED Results / Procedures / Treatments   Labs (all labs ordered are listed, but only abnormal results are displayed) Labs Reviewed  CBG MONITORING, ED - Abnormal; Notable for the following components:      Result Value   Glucose-Capillary 149 (*)    All other components within normal limits    EKG None  Radiology No results found.  Procedures Procedures   Medications Ordered in ED Medications  sodium chloride 0.9 % bolus 1,000 mL (has no administration in time range)  LORazepam (ATIVAN) injection 1 mg (has no administration in time range)    ED Course  I have reviewed the triage vital signs and the nursing notes.  Pertinent labs & imaging results that were available during my care of the patient were reviewed by me and considered in my medical decision making (see chart for details).    MDM Rules/Calculators/A&P  Patient sent by EMS for evaluation of altered mental status and left-sided weakness/convulsions.  This was described in the HPI.  Patient arrived here retching, alternating flailing her left arm and leg and flaccidity, as well as inability to speak and respond that also seem to come and go.  Patient's presentation was somewhat puzzling, whether this represented a focal seizure, pseudoseizure, or some other episode I am uncertain.  This did not appear to be a stroke and as such a code stroke was not initiated.  Patient sent for CT scan of the head as well as abdomen and pelvis.  Both of these were unremarkable.  Laboratory studies have returned and are unremarkable as well with the exception of a lactate of 5.6.  Patient was hydrated with 2 L of normal saline and this was  repeated and currently pending.  She did receive a dose of IV Ativan with equivocal improvement.  As patient has not returned to baseline, I feel as though admission for further observation is indicated.  I have spoken with the hospitalist who agrees to admit.  CRITICAL CARE Performed by: Geoffery Lyons Total critical care time: 40 minutes Critical care time was exclusive of separately billable procedures and treating other patients. Critical care was necessary to treat or prevent imminent or life-threatening  deterioration. Critical care was time spent personally by me on the following activities: development of treatment plan with patient and/or surrogate as well as nursing, discussions with consultants, evaluation of patient's response to treatment, examination of patient, obtaining history from patient or surrogate, ordering and performing treatments and interventions, ordering and review of laboratory studies, ordering and review of radiographic studies, pulse oximetry and re-evaluation of patient's condition.   Final Clinical Impression(s) / ED Diagnoses Final diagnoses:  None    Rx / DC Orders ED Discharge Orders     None        Geoffery Lyons, MD 10/10/20 [phone]

## 2020-10-10 NOTE — Progress Notes (Signed)
EEG Completed; Results Pending  

## 2020-10-10 NOTE — Progress Notes (Signed)
PROGRESS NOTE  Brief Narrative: Madison Clay is a 25 y.o. adult with a history of ADD, obesity and marijuana use who presented to the  ED after an episode of palpitations, anxiety, shaking, heaviness of the extremities, vision changes prompting EMS activation. EMS reported lip smacking. There has been a similar episode in April 2022 for which work up only revealed hypokalemia and she was discharged home after supplementation. In the ED, VSS, WBC 13.7k, lactic acid elevated to 5.6 > 3.1 with 2L IVF, potassium 2.8, UPT, tylenol, salicylate, EtOH all negative. She had a normal non-contrast CT head, was given ativan with some improvement in symptoms. She was admitted a couple hours ago with plans for neurology consultation and further work up with MRI and EEG.  Subjective: Feels better than when she got here, but still just in a general way tired and slow. No focal numbness or weakness. No confusion, but there are some elements PTA she can't recall.   Objective: BP (!) 115/57   Pulse 77   Temp 98.9 F (37.2 C) (Oral)   Resp 17   Ht 6\' 2"  (1.88 m)   Wt 113.4 kg   SpO2 97%   BMI 32.10 kg/m   Gen: 25yo F in no distress Pulm: Clear and nonlabored on on room air  CV: RRR, no murmur, no JVD, no edema GI: Soft, NT, ND, +BS  Neuro: Alert and oriented. No focal deficits. Skin: No rashes, lesions or ulcers  Assessment & Plan: Active Problems:   Acute metabolic encephalopathy   Lactic acidosis   AKI (acute kidney injury) (HCC)   Hypokalemia   Leukocytosis  Acute metabolic encephalopathy: with primary concern for seizure, though there are atypical features. Possibly anxiety/panic disorder response to multiple life stressors (losing job, transportation). Severe hypokalemia may or may not be contributing. CT head unremarkable and pt without significant CVA risk factors except for +FH in mother w/stroke in her 52's.  - MRI brain ordered - Add EEG  - Neurology consulted - UDS is still pending  and needs to be collected ASAP. Pt aware.  Lactic acid elevation, leukocytosis: Nonspecific, though could be reactive to PTA event which may suggest seizure.  - Continue to trend to confirm improvement.  - UA pending. No significant symptoms to suggest alternative infection.  Hypokalemia:  - Supplement ordered. Will empirically add Mg and check both in AM  Marijuana use:  - Cessation counseling provided.   31's, MD Pager on amion 10/10/2020, 9:45 AM

## 2020-10-10 NOTE — ED Notes (Signed)
Date and time results received: 10/10/20 0257  Test: Lactic Critical Value: 5.6  Name of Provider Notified: [name], MD

## 2020-10-10 NOTE — H&P (Addendum)
TRH H&P    Patient Demographics:    Madison Clay, is a 25 y.o. adult  MRN: [id number]  DOB - [birth year]  Admit Date - 10/10/2020  Referring MD/NP/PA: [name]  Outpatient Primary MD for the patient is Madison Giovanni, MD  Patient coming from: Home  Chief complaint-palpitations   HPI:    Madison Clay  is a 25 y.o. adult, with history of ADD, obesity, and more presents the ED with a chief complaint of palpitations.  Patient reports that she was sitting down when her chest started feeling funny.  She started feeling jittery, and then anxious.  She then started having a pain in her left hip and left lower quadrant.  She called her mom and dad, and they advised her to call the ambulance.  She reports the next thing she remembers is sitting in a chair waiting for the ambulance.  On further questioning she does report that she remembers her body shaking.  She is not exactly sure what part of her body was shaking.  She had double vision then, and that has not cleared up.  She looks just out of 1 eye her vision is clear, but when she uses both eyes her vision is double.  She reports no changes in hearing.  She had difficulty speaking.  She felt like she wanted to say something but could not get any words out.  She reports that in the EMS she was lipsmacking.  She did not have urine or bowel incontinence.  She reports that she could hear people talking to her but could not answer them.  Patient does report she has had a lot of stress lately.  She lost her job in March, and then her vehicle broke down.  She now has no transportation.  She is stressed out about her dad coming to bring her new vehicle.  These are all things that have been weighing on her mind.  Patient also reports that now in the bed she feels like her arms and legs are heavy.  Her left side is worse than the right side.  She did have Ativan in the ER.  Patient  has a family history with mother having her first stroke in the 62s.  Patient also had a previous episode that was similar to this in April.  She reports that she felt like she was drunk and then had anxiety and shortness of breath with left arm tingling so she came into the ER.  At that time she was told she had hypokalemia and was given 2 potassium tabs and sent home.  Patient reports that she smokes marijuana every night.  She does not smoke cigarettes.  She does not drink alcohol.  She is full code.  In the ED Temperature 97.8, heart rate 110 243, respiratory rate 13-31, blood pressure 126/80, satting 100% White blood cell count 13.7, hemoglobin 14.1, platelets 368 Chemistry panel reveals a hypokalemia with potassium of 2.8 Lipase 28, lactic acid 5.6 and then 3.1 Negative COVID test Pregnancy test is negative Alcohol, salicylate, Tylenol  level negative CT abdomen -because patient was recently seen in urgent care for abdominal pain -shows no acute intra-abdominal findings that would explain the symptoms.  Mild colonic diverticulosis without superimposed diverticulitis. CT head shows normal Noncon CT of the brain Patient was given Ativan, 2 L normal saline Admission requested for further work-up of possible neuro disorder    Review of systems:    In addition to the HPI above,  No Fever-chills, No Headache, admits to double vision, No problems swallowing food or Liquids, No Chest pain, admits to palpitations, but no cough or Shortness of Breath, No Abdominal pain, No Nausea or Vomiting, bowel movements are regular, No Blood in stool or Urine, No dysuria, No new skin rashes or bruises, No new joints pains-aches,  No new weakness, tingling, numbness in any extremity, No recent weight gain or loss, No polyuria, polydypsia or polyphagia,   All other systems reviewed and are negative.    Past History of the following :    Past Medical History:  Diagnosis Date   ADD (attention  deficit disorder)    Allergy    COVID-19 virus infection 12/2019   Morbid obesity (HCC)       History reviewed. No pertinent surgical history.    Social History:      Social History   Tobacco Use   Smoking status: Former    Types: Cigarettes   Smokeless tobacco: Never   Tobacco comments:    social smoker  Substance Use Topics   Alcohol use: Yes    Comment: mostly on weekends       Family History :     Family History  Problem Relation Age of Onset   Stroke Mother    Hernia Father    Mother had a stroke in her 3s   Home Medications:   Prior to Admission medications   Medication Sig Start Date End Date Taking? Authorizing Provider  dicyclomine (BENTYL) 20 MG tablet Take 1 tablet (20 mg total) by mouth 2 (two) times daily. 03/09/20   Henderly, Britni A, PA-C  naproxen (NAPROSYN) 500 MG tablet Take 1 tablet (500 mg total) by mouth 2 (two) times daily. 09/01/18   Aviva Kluver B, PA-C  ondansetron (ZOFRAN) 4 MG tablet Take 1 tablet (4 mg total) by mouth every 8 (eight) hours as needed for nausea or vomiting. 03/09/20   Henderly, Britni A, PA-C  promethazine (PHENERGAN) 25 MG tablet Take 1 tablet (25 mg total) by mouth every 6 (six) hours as needed for nausea or vomiting. 06/30/19   Idol, Raynelle Fanning, PA-C  promethazine-dextromethorphan (PROMETHAZINE-DM) 6.25-15 MG/5ML syrup Take 5 mLs by mouth 4 (four) times daily as needed for cough. 07/31/20   Bing Neighbors, FNP     Allergies:    No Known Allergies   Physical Exam:   Vitals  Blood pressure 127/86, pulse 97, temperature 97.8 F (36.6 C), temperature source Axillary, resp. rate 12, height  (1.88 m), weight 113.4 kg, SpO2 100 %. 1.  General: Patient lying supine in bed,  no acute distress, drowsy from Ativan   2. Psychiatric: Drowsy and oriented x 3, mood and behavior normal for situation, pleasant and cooperative with exam   3. Neurologic: Speech and language are normal, face is symmetric, moves all 4  extremities voluntarily, at baseline without acute deficits on limited exam, normal finger-to-nose, left upper extremity with 4 out of 5 strength compared to 5 out of 5 strength in other 3 extremities   4. HEENMT:  Head is atraumatic, normocephalic, pupils reactive to light, neck is supple, trachea is midline, mucous membranes are moist   5. Respiratory : Lungs are clear to auscultation bilaterally without wheezing, rhonchi, rales, no cyanosis, no increase in work of breathing or accessory muscle use   6. Cardiovascular : Heart rate normal, rhythm is regular, no murmurs, rubs or gallops, no peripheral edema, peripheral pulses palpated   7. Gastrointestinal:  Abdomen is soft, nondistended, nontender to palpation bowel sounds active, no masses or organomegaly palpated   8. Skin:  Skin is warm, dry and intact without rashes, acute lesions, or ulcers on limited exam   9.Musculoskeletal:  No acute deformities or trauma, no asymmetry in tone, no peripheral edema, peripheral pulses palpated, no tenderness to palpation in the extremities     Data Review:    CBC Recent Labs  Lab 10/10/20 0151  WBC 13.7*  HGB 14.1  HCT 41.3  PLT 368  MCV 88.4  MCH 30.2  MCHC 34.1  RDW 12.6  LYMPHSABS 3.6  MONOABS 0.8  EOSABS 0.2  BASOSABS 0.1   ------------------------------------------------------------------------------------------------------------------  Results for orders placed or performed during the hospital encounter of 10/10/20 (from the past 48 hour(s))  CBG monitoring, ED     Status: Abnormal   Collection Time: 10/10/20  1:48 AM  Result Value Ref Range   Glucose-Capillary 149 (H) 70 - 99 mg/dL    Comment: Glucose reference range applies only to samples taken after fasting for at least 8 hours.  Acetaminophen level     Status: Abnormal   Collection Time: 10/10/20  1:51 AM  Result Value Ref Range   Acetaminophen (Tylenol), Serum <10 (L) 10 - 30 ug/mL    Comment:  (NOTE) Therapeutic concentrations vary significantly. A range of 10-30 ug/mL  may be an effective concentration for many patients. However, some  are best treated at concentrations outside of this range. Acetaminophen concentrations >150 ug/mL at 4 hours after ingestion  and >50 ug/mL at 12 hours after ingestion are often associated with  toxic reactions.  Performed at [facility], [address] [place], [place] [address]   Ethanol     Status: None   Collection Time: 10/10/20  1:51 AM  Result Value Ref Range   Alcohol, Ethyl (B) <10 <10 mg/dL    Comment: (NOTE) Lowest detectable limit for serum alcohol is 10 mg/dL.  For medical purposes only. Performed at [facility], [address] [place], [place] [address]   Comprehensive metabolic panel     Status: Abnormal   Collection Time: 10/10/20  1:51 AM  Result Value Ref Range   Sodium 136 135 - 145 mmol/L   Potassium 2.8 (L) 3.5 - 5.1 mmol/L   Chloride 107 98 - 111 mmol/L   CO2 16 (L) 22 - 32 mmol/L   Glucose, Bld 172 (H) 70 - 99 mg/dL    Comment: Glucose reference range applies only to samples taken after fasting for at least 8 hours.   BUN 11 6 - 20 mg/dL   Creatinine, Ser 2.131.02 (H) 0.44 - 1.00 mg/dL   Calcium 9.1 8.9 - 08.610.3 mg/dL   Total Protein 7.9 6.5 - 8.1 g/dL   Albumin 4.7 3.5 - 5.0 g/dL   AST 23 15 - 41 U/L   ALT 26 0 - 44 U/L   Alkaline Phosphatase 53 38 - 126 U/L   Total Bilirubin 0.4 0.3 - 1.2 mg/dL   GFR, Estimated >57>60 >84>60 mL/min    Comment: (NOTE) Calculated  using the CKD-EPI Creatinine Equation (2021)    Anion gap 13 5 - 15    Comment: Performed at [facility], [address] [place], [place] [address]  Lipase, blood     Status: None   Collection Time: 10/10/20  1:51 AM  Result Value Ref Range   Lipase 28 11 - 51 U/L    Comment: Performed at [facility], [address] [place], [place] [address]  Salicylate level     Status: Abnormal   Collection Time: 10/10/20  1:51 AM  Result Value Ref Range    Salicylate Lvl <7.0 (L) 7.0 - 30.0 mg/dL    Comment: Performed at [facility], [address], [place], [place] [address]  CBC with Differential     Status: Abnormal   Collection Time: 10/10/20  1:51 AM  Result Value Ref Range   WBC 13.7 (H) 4.0 - 10.5 K/uL   RBC 4.67 3.87 - 5.11 MIL/uL   Hemoglobin 14.1 12.0 - 15.0 g/dL   HCT 01.7 51.0 - 25.8 %   MCV 88.4 80.0 - 100.0 fL   MCH 30.2 26.0 - 34.0 pg   MCHC 34.1 30.0 - 36.0 g/dL   RDW 52.7 78.2 - 42.3 %   Platelets 368 150 - 400 K/uL   nRBC 0.0 0.0 - 0.2 %   Neutrophils Relative % 66 %   Neutro Abs 9.0 (H) 1.7 - 7.7 K/uL   Lymphocytes Relative 26 %   Lymphs Abs 3.6 0.7 - 4.0 K/uL   Monocytes Relative 6 %   Monocytes Absolute 0.8 0.1 - 1.0 K/uL   Eosinophils Relative 1 %   Eosinophils Absolute 0.2 0.0 - 0.5 K/uL   Basophils Relative 1 %   Basophils Absolute 0.1 0.0 - 0.1 K/uL   Immature Granulocytes 0 %   Abs Immature Granulocytes 0.03 0.00 - 0.07 K/uL    Comment: Performed at [facility], [address], [place], [place] [address]  Resp Panel by RT-PCR (Flu A&B, Covid) Nasopharyngeal Swab     Status: None   Collection Time: 10/10/20  1:52 AM   Specimen: Nasopharyngeal Swab; Nasopharyngeal(NP) swabs in vial transport medium  Result Value Ref Range   SARS Coronavirus 2 by RT PCR NEGATIVE NEGATIVE    Comment: (NOTE) SARS-CoV-2 target nucleic acids are NOT DETECTED.  The SARS-CoV-2 RNA is generally detectable in upper respiratory specimens during the acute phase of infection. The lowest concentration of SARS-CoV-2 viral copies this assay can detect is 138 copies/mL. A negative result does not preclude SARS-Cov-2 infection and should not be used as the sole basis for treatment or other patient management decisions. A negative result may occur with  improper specimen collection/handling, submission of specimen other than nasopharyngeal swab, presence of viral mutation(s) within the areas targeted by this assay, and inadequate  number of viral copies(<138 copies/mL). A negative result must be combined with clinical observations, patient history, and epidemiological information. The expected result is Negative.  Fact Sheet for Patients:  BloggerCourse.com  Fact Sheet for Healthcare Providers:  SeriousBroker.it  This test is no t yet approved or cleared by the [place] FDA and  has been authorized for detection and/or diagnosis of SARS-CoV-2 by FDA under an Emergency Use Authorization (EUA). This EUA will remain  in effect (meaning this test can be used) for the duration of the COVID-19 declaration under Section 564(b)(1) of the Act, 21 U.S.C.section 360bbb-3(b)(1), unless the authorization is terminated  or revoked sooner.  Influenza A by PCR NEGATIVE NEGATIVE   Influenza B by PCR NEGATIVE NEGATIVE    Comment: (NOTE) The Xpert Xpress SARS-CoV-2/FLU/RSV plus assay is intended as an aid in the diagnosis of influenza from Nasopharyngeal swab specimens and should not be used as a sole basis for treatment. Nasal washings and aspirates are unacceptable for Xpert Xpress SARS-CoV-2/FLU/RSV testing.  Fact Sheet for Patients: BloggerCourse.com  Fact Sheet for Healthcare Providers: SeriousBroker.it  This test is not yet approved or cleared by the [place] FDA and has been authorized for detection and/or diagnosis of SARS-CoV-2 by FDA under an Emergency Use Authorization (EUA). This EUA will remain in effect (meaning this test can be used) for the duration of the COVID-19 declaration under Section 564(b)(1) of the Act, 21 U.S.C. section 360bbb-3(b)(1), unless the authorization is terminated or revoked.  Performed at [facility], [address], [place], [place] [address]   Lactic acid, plasma     Status: Abnormal   Collection Time: 10/10/20  2:31 AM  Result Value Ref Range   Lactic Acid,  Venous 5.6 (HH) 0.5 - 1.9 mmol/L    Comment: CRITICAL RESULT CALLED TO, READ BACK BY AND VERIFIED WITH: GESELL,K  BY MATTHEWS, B 8.23.22 Performed at [facility], [address], [place], [place] [address]   I-Stat Beta hCG blood, ED ([facility], [facility], [facility] only)     Status: None   Collection Time: 10/10/20  2:41 AM  Result Value Ref Range   I-stat hCG, quantitative <5.0 <5 mIU/mL   Comment 3            Comment:   GEST. AGE      CONC.  (mIU/mL)   <=1 WEEK        5 - 50     2 WEEKS       50 - 500     3 WEEKS       100 - 10,000     4 WEEKS     1,000 - 30,000        FEMALE AND NON-PREGNANT FEMALE:     LESS THAN 5 mIU/mL   Lactic acid, plasma     Status: Abnormal   Collection Time: 10/10/20  4:30 AM  Result Value Ref Range   Lactic Acid, Venous 3.1 (HH) 0.5 - 1.9 mmol/L    Comment: CRITICAL VALUE NOTED.  VALUE IS CONSISTENT WITH PREVIOUSLY REPORTED AND CALLED VALUE. Performed at [facility], [address] [place], [place] [address]     Chemistries  Recent Labs  Lab 10/10/20 0151  NA 136  K 2.8*  CL 107  CO2 16*  GLUCOSE 172*  BUN 11  CREATININE 1.02*  CALCIUM 9.1  AST 23  ALT 26  ALKPHOS 53  BILITOT 0.4   ------------------------------------------------------------------------------------------------------------------  ------------------------------------------------------------------------------------------------------------------ GFR: Estimated Creatinine Clearance (by C-G formula based on SCr of 1.02 mg/dL (H)) Female: 914.7 mL/min (A) Female: 150.9 mL/min (A) Liver Function Tests: Recent Labs  Lab 10/10/20 0151  AST 23  ALT 26  ALKPHOS 53  BILITOT 0.4  PROT 7.9  ALBUMIN 4.7   Recent Labs  Lab 10/10/20 0151  LIPASE 28   No results for input(s): AMMONIA in the last 168 hours. Coagulation Profile: No results for input(s): INR, PROTIME in the last 168 hours. Cardiac Enzymes: No results for input(s): CKTOTAL, CKMB, CKMBINDEX, TROPONINI in the last 168  hours. BNP (last 3 results) No results for input(s): PROBNP in the last 8760 hours. HbA1C: No results for input(s): HGBA1C  in the last 72 hours. CBG: Recent Labs  Lab 10/10/20 0148  GLUCAP 149*   Lipid Profile: No results for input(s): CHOL, HDL, LDLCALC, TRIG, CHOLHDL, LDLDIRECT in the last 72 hours. Thyroid Function Tests: No results for input(s): TSH, T4TOTAL, FREET4, T3FREE, THYROIDAB in the last 72 hours. Anemia Panel: No results for input(s): VITAMINB12, FOLATE, FERRITIN, TIBC, IRON, RETICCTPCT in the last 72 hours.  --------------------------------------------------------------------------------------------------------------- Urine analysis:    Component Value Date/Time   COLORURINE YELLOW 03/09/2020 1713   APPEARANCEUR HAZY (A) 03/09/2020 1713   LABSPEC 1.016 03/09/2020 1713   PHURINE 7.0 03/09/2020 1713   GLUCOSEU NEGATIVE 03/09/2020 1713   HGBUR NEGATIVE 03/09/2020 1713   BILIRUBINUR negative 09/29/2020 1634   KETONESUR small (15) (A) 09/29/2020 1634   KETONESUR NEGATIVE 03/09/2020 1713   PROTEINUR negative 09/29/2020 1634   PROTEINUR NEGATIVE 03/09/2020 1713   UROBILINOGEN 0.2 09/29/2020 1634   NITRITE Negative 09/29/2020 1634   NITRITE NEGATIVE 03/09/2020 1713   LEUKOCYTESUR Negative 09/29/2020 1634   LEUKOCYTESUR NEGATIVE 03/09/2020 1713      Imaging Results:    CT HEAD WO CONTRAST ( )  Result Date: 10/10/2020 CLINICAL DATA:  Altered mental status. EXAM: CT HEAD WITHOUT CONTRAST TECHNIQUE: Contiguous axial images were obtained from the base of the skull through the vertex without intravenous contrast. COMPARISON:  Head CT dated 04/06/2020. FINDINGS: Brain: No evidence of acute infarction, hemorrhage, hydrocephalus, extra-axial collection or mass lesion/mass effect. Vascular: No hyperdense vessel or unexpected calcification. Skull: Normal. Negative for fracture or focal lesion. Sinuses/Orbits: No acute finding. Other: None IMPRESSION: Normal noncontrast CT  of the brain. Electronically Signed   By: Elgie Collard M.D.   On: 10/10/2020 03:27   CT ABDOMEN PELVIS W CONTRAST  Result Date: 10/10/2020 CLINICAL DATA:  Left-sided abdominal pain EXAM: CT ABDOMEN AND PELVIS WITH CONTRAST TECHNIQUE: Multidetector CT imaging of the abdomen and pelvis was performed using the standard protocol following bolus administration of intravenous contrast. CONTRAST:  OMNIPAQUE IOHEXOL 300 MG/ML  SOLN COMPARISON:  03/09/2020 FINDINGS: Lower chest: No acute abnormality. Hepatobiliary: No focal liver abnormality is seen. No gallstones, gallbladder wall thickening, or biliary dilatation. Pancreas: Unremarkable Spleen: Unremarkable Adrenals/Urinary Tract: Adrenal glands are unremarkable. Kidneys are normal, without renal calculi, focal lesion, or hydronephrosis. Bladder is unremarkable. Stomach/Bowel: Mild transverse and descending colonic diverticulosis. No superimposed acute inflammatory change. The stomach, small bowel, and large bowel are otherwise unremarkable. Appendix normal. No free intraperitoneal gas or fluid. Vascular/Lymphatic: No significant vascular findings are present. No enlarged abdominal or pelvic lymph nodes. Reproductive: Uterus and bilateral adnexa are unremarkable. Other: No abdominal wall hernia.  Rectum unremarkable. Musculoskeletal: No acute or significant osseous findings. IMPRESSION: No acute intra-abdominal pathology identified. No definite radiographic explanation for the patient's reported symptoms. Mild colonic diverticulosis without superimposed inflammatory change. Electronically Signed   By: Helyn Numbers M.D.   On: 10/10/2020 03:30      Assessment & Plan:    Active Problems:   Acute metabolic encephalopathy   Lactic acidosis   AKI (acute kidney injury) (HCC)   Hypokalemia   Leukocytosis   Acute metabolic encephalopathy w/Left arm weakness Seizure-like activity was described with body shaking, however patient remembers it and did not  have a postictal state Possible focal seizure? Consult neurology, appreciate recs Replace electrolytes Seizure precautions UDS pending With left arm weakness - MRI ordered Normal CT Continue to monitor Hypokalemia Replace and recheck Lactic acidosis If there was seizure-like activities to explain lactic acidosis No infectious symptoms Patient received 2  L in the ED and her lactic acid improved to 3, give another liter and check again at 10 AM Continue to monitor Leukocytosis This could also be explained by seizure activity No infectious symptoms UA pending Substance abuse Smokes marijuana every night Counseled on the importance of cessation UDS pending   DVT Prophylaxis-   heparin- SCDs   AM Labs Ordered, also please review Full Orders  Family Communication: Admission, patients condition and plan of care including tests being ordered have been discussed with the patient and grandmother who indicate understanding and agree with the plan and Code Status.  Code Status: Full  Admission status: Observation Time spent in minutes : 65   Kannen Moxey B Zierle-Ghosh DO

## 2020-10-10 NOTE — Progress Notes (Signed)
OT Cancellation Note  Patient Details Name: Madison Clay MRN: [id number] DOB: [birth year]   Cancelled Treatment:    Reason Eval/Treat Not Completed: OT screened, no needs identified, will sign off. MRI negative for acute infarct/changes. Pt is functioning at baseline, BUE WFL, independent in mobility. No further OT services required at this time.    Ezra Sites, OTR/L  [phone] 10/10/2020, 4:42 PM

## 2020-10-10 NOTE — Progress Notes (Signed)
SLP Cancellation Note  Patient Details Name: Madison Clay MRN: [id number] DOB: 10/17/1995   Cancelled treatment:       Reason Eval/Treat Not Completed: SLP screened, no needs identified, will sign off. SLP screened Pt in room. Pt denies any changes in swallowing, speech, language, or cognition. MRI negative for acute changes. SLE will be deferred at this time. Reconsult if indicated. SLP will sign off.   Thank you,  Havery Moros, CCC-SLP [phone]    [name] 10/10/2020, 2:24 PM

## 2020-10-10 NOTE — Procedures (Signed)
Patient Name: Madison Clay  MRN: [id number]  Epilepsy Attending: Charlsie Quest  Referring Physician/Provider: Dr Hazeline Junker Date: 10/10/2020 Duration: 21.50 mins  Patient history: 25 year old female with altered mental status.  EEG to evaluate for seizures.  Level of alertness: Awake  AEDs during EEG study: None  Technical aspects: This EEG study was done with scalp electrodes positioned according to the 10-20 International system of electrode placement. Electrical activity was acquired at a sampling rate of 500Hz  and reviewed with a high frequency filter of 70Hz  and a low frequency filter of 1Hz . EEG data were recorded continuously and digitally stored.   Description: The posterior dominant rhythm consists of 10 Hz activity of moderate voltage (25-35 uV) seen predominantly in posterior head regions, symmetric and reactive to eye opening and eye closing. There is an excessive amount of 15 to 18 Hz beta activity with irregular morphology distributed symmetrically and diffusely.  Physiologic photic driving was not seen during photic stimulation.  No EEG changes seen during hyperventilation.    ABNORMALITY - Excessive beta, generalized  IMPRESSION: This study is within normal limits. The excessive beta activity seen in the background is most likely due to the effect of benzodiazepine and is a benign EEG pattern. No seizures or epileptiform discharges were seen throughout the recording.  Twan Harkin 

## 2020-10-10 NOTE — Plan of Care (Signed)
  Problem: Education: Goal: Knowledge of General Education information will improve Description Including pain rating scale, medication(s)/side effects and non-pharmacologic comfort measures Outcome: Progressing   

## 2020-10-10 NOTE — Evaluation (Signed)
Physical Therapy Evaluation Patient Details Name: Madison Clay MRN: [id number] DOB: 04/11/1995 Today's Date: 10/10/2020   History of Present Illness  Madison Clay  is a 25 y.o. adult, with history of ADD, obesity, and more presents the ED with a chief complaint of palpitations.  Patient reports that she was sitting down when her chest started feeling funny.  She started feeling jittery, and then anxious.  She then started having a pain in her left hip and left lower quadrant.  She called her mom and dad, and they advised her to call the ambulance.  She reports the next thing she remembers is sitting in a chair waiting for the ambulance.  On further questioning she does report that she remembers her body shaking.  She is not exactly sure what part of her body was shaking.  She had double vision then, and that has not cleared up.  She looks just out of 1 eye her vision is clear, but when she uses both eyes her vision is double.  She reports no changes in hearing.  She had difficulty speaking.  She felt like she wanted to say something but could not get any words out.  She reports that in the EMS she was lipsmacking.  She did not have urine or bowel incontinence.  She reports that she could hear people talking to her but could not answer them.  Patient does report she has had a lot of stress lately.  She lost her job in March, and then her vehicle broke down.  She now has no transportation.  She is stressed out about her dad coming to bring her new vehicle.  These are all things that have been weighing on her mind.  Patient also reports that now in the bed she feels like her arms and legs are heavy.  Her left side is worse than the right side.  She did have Ativan in the ER.  Patient has a family history with mother having her first stroke in the 25s.  Patient also had a previous episode that was similar to this in April.  She reports that she felt like she was drunk and then had anxiety and shortness  of breath with left arm tingling so she came into the ER.  At that time she was told she had hypokalemia and was given 2 potassium tabs and sent home.   Clinical Impression  Patient functioning at baseline for functional mobility and gait.  Plan:  Patient discharged from physical therapy to care of nursing for ambulation ad lib for length of stay.      Follow Up Recommendations No PT follow up    Equipment Recommendations  None recommended by PT    Recommendations for Other Services       Precautions / Restrictions Precautions Precautions: None Restrictions Weight Bearing Restrictions: No      Mobility  Bed Mobility Overal bed mobility: Independent                  Transfers Overall transfer level: Independent                  Ambulation/Gait Ambulation/Gait assistance: Modified independent (Device/Increase time) Gait Distance (Feet): 100 Feet Assistive device: None Gait Pattern/deviations: WFL(Within Functional Limits) Gait velocity: near normal   General Gait Details: demonstrates good return for ambulation in room and hallways without loss of balance  Stairs            Wheelchair Mobility  Modified Rankin (Stroke Patients Only)       Balance Overall balance assessment: No apparent balance deficits (not formally assessed)                                           Pertinent Vitals/Pain Pain Assessment: No/denies pain    Home Living Family/patient expects to be discharged to:: Private residence Living Arrangements: Alone Available Help at Discharge: Family;Available PRN/intermittently Type of Home: House Home Access: Stairs to enter Entrance Stairs-Rails: Left Entrance Stairs-Number of Steps: 2 Home Layout: One level Home Equipment: None      Prior Function Level of Independence: Independent         Comments: Tourist information centre manager, drives     Higher education careers adviser        Extremity/Trunk Assessment    Upper Extremity Assessment Upper Extremity Assessment: Overall WFL for tasks assessed    Lower Extremity Assessment Lower Extremity Assessment: Overall WFL for tasks assessed    Cervical / Trunk Assessment Cervical / Trunk Assessment: Normal  Communication   Communication: No difficulties  Cognition Arousal/Alertness: Awake/alert Behavior During Therapy: WFL for tasks assessed/performed Overall Cognitive Status: Within Functional Limits for tasks assessed                                        General Comments      Exercises     Assessment/Plan    PT Assessment Patent does not need any further PT services  PT Problem List         PT Treatment Interventions      PT Goals (Current goals can be found in the Care Plan section)  Acute Rehab PT Goals Patient Stated Goal: return home PT Goal Formulation: With patient Time For Goal Achievement: 10/10/20 Potential to Achieve Goals: Good    Frequency     Barriers to discharge        Co-evaluation               AM-PAC PT "6 Clicks" Mobility  Outcome Measure Help needed turning from your back to your side while in a flat bed without using bedrails?: None Help needed moving from lying on your back to sitting on the side of a flat bed without using bedrails?: None Help needed moving to and from a bed to a chair (including a wheelchair)?: None Help needed standing up from a chair using your arms (e.g., wheelchair or bedside chair)?: None Help needed to walk in hospital room?: None Help needed climbing 3-5 steps with a railing? : None 6 Click Score: 24    End of Session   Activity Tolerance: Patient tolerated treatment well Patient left: Other (comment) (left in wheelchair with nursing staff to go to a procedure) Nurse Communication: Mobility status PT Visit Diagnosis: Unsteadiness on feet (R26.81);Other abnormalities of gait and mobility (R26.89);Muscle weakness (generalized) (M62.81)    Time:  6644-0347 PT Time Calculation (min) (ACUTE ONLY): 15 min   Charges:   PT Evaluation $PT Eval Low Complexity: 1 Low PT Treatments $Therapeutic Activity: 8-22 mins        1:46 PM, 10/10/20 Ocie Bob, MPT Physical Therapist with [facility] [phone] office [phone] mobile phone

## 2020-10-11 ENCOUNTER — Other Ambulatory Visit (HOSPITAL_COMMUNITY): Payer: PRIVATE HEALTH INSURANCE

## 2020-10-11 DIAGNOSIS — R4182 Altered mental status, unspecified: Secondary | ICD-10-CM

## 2020-10-11 LAB — COMPREHENSIVE METABOLIC PANEL
ALT: 19 U/L (ref 0–44)
AST: 9 U/L — ABNORMAL LOW (ref 15–41)
Albumin: 4.1 g/dL (ref 3.5–5.0)
Alkaline Phosphatase: 42 U/L (ref 38–126)
Anion gap: 7 (ref 5–15)
BUN: 7 mg/dL (ref 6–20)
CO2: 26 mmol/L (ref 22–32)
Calcium: 9.3 mg/dL (ref 8.9–10.3)
Chloride: 106 mmol/L (ref 98–111)
Creatinine, Ser: 0.62 mg/dL (ref 0.44–1.00)
GFR, Estimated: >60 mL/min (ref >60)
Glucose, Bld: 88 mg/dL (ref 70–99)
Potassium: 4 mmol/L (ref 3.5–5.1)
Sodium: 139 mmol/L (ref 135–145)
Total Bilirubin: 0.6 mg/dL (ref 0.3–1.2)
Total Protein: 7 g/dL (ref 6.5–8.1)

## 2020-10-11 LAB — LIPID PANEL
Cholesterol: 170 mg/dL (ref 0–200)
HDL: 40 mg/dL — ABNORMAL LOW (ref >40)
LDL Cholesterol: 105 mg/dL — ABNORMAL HIGH (ref 0–99)
Total CHOL/HDL Ratio: 4.3 RATIO
Triglycerides: 126 mg/dL (ref <150)
VLDL: 25 mg/dL (ref 0–40)

## 2020-10-11 LAB — CBC
HCT: 38.2 % (ref 36.0–46.0)
Hemoglobin: 12.5 g/dL (ref 12.0–15.0)
MCH: 29.8 pg (ref 26.0–34.0)
MCHC: 32.7 g/dL (ref 30.0–36.0)
MCV: 91.2 fL (ref 80.0–100.0)
Platelets: 272 10*3/uL (ref 150–400)
RBC: 4.19 MIL/uL (ref 3.87–5.11)
RDW: 12.9 % (ref 11.5–15.5)
WBC: 7.5 10*3/uL (ref 4.0–10.5)
nRBC: 0 % (ref 0.0–0.2)

## 2020-10-11 LAB — HEMOGLOBIN A1C
Hgb A1c MFr Bld: 5.6 % (ref 4.8–5.6)
Mean Plasma Glucose: 114.02 mg/dL

## 2020-10-11 LAB — HIV ANTIBODY (ROUTINE TESTING W REFLEX): HIV Screen 4th Generation wRfx: NONREACTIVE

## 2020-10-11 LAB — MAGNESIUM: Magnesium: 2 mg/dL (ref 1.7–2.4)

## 2020-10-11 NOTE — Discharge Summary (Signed)
Physician Discharge Summary  BLAKLEY MICHNA JJO:[id number] DOB: 04-Dec-1995 DOA: 10/10/2020  PCP: [name], MD  Admit date: 10/10/2020 Discharge date: 10/11/2020  Admitted From: Home  Disposition:  Left AMA, She didn't wait for neurology evalution.   Recommendations for Outpatient Follow-up:  Follow up with PCP in 1-2 weeks Please obtain BMP/CBC in one week   Brief/Interim Summary: Brief Narrative: Madison Clay is a 25 y.o. adult with a history of ADD, obesity and marijuana use who presented to the  ED after an episode of palpitations, anxiety, shaking, heaviness of the extremities, vision changes prompting EMS activation. EMS reported lip smacking. There has been a similar episode in April 2022 for which work up only revealed hypokalemia and she was discharged home after supplementation. In the ED, VSS, WBC 13.7k, lactic acid elevated to 5.6 > 3.1 with 2L IVF, potassium 2.8, UPT, tylenol, salicylate, EtOH all negative. She had a normal non-contrast CT head, was given ativan with some improvement in symptoms. She was admitted a couple hours ago with plans for neurology consultation and further work up with MRI and EEG.  MRI and EEG negative, awaiting neurology evaluation when patient left AMA>   Acute metabolic Encephalopathy; resolved Unclear etiology; differential; anxiety/panic disorder, hypokalemia, ruling out seizure.  MRI negative.  EEG negative.  Neurology consulted. Patient left prior to neurologist evalution.   Lactic acidosis; resolved.  No evidence of infection.   Hypokalemia  Related to history of diarrhea.  Resolved.    Discharge Diagnoses:  Active Problems:   Acute metabolic encephalopathy   Lactic acidosis   AKI (acute kidney injury) (HCC)   Hypokalemia   Leukocytosis    Discharge Instructions     No Known Allergies  Consultations: Neurology    Procedures/Studies: CT HEAD WO CONTRAST ( )  Result Date: 10/10/2020 CLINICAL DATA:   Altered mental status. EXAM: CT HEAD WITHOUT CONTRAST TECHNIQUE: Contiguous axial images were obtained from the base of the skull through the vertex without intravenous contrast. COMPARISON:  Head CT dated 04/06/2020. FINDINGS: Brain: No evidence of acute infarction, hemorrhage, hydrocephalus, extra-axial collection or mass lesion/mass effect. Vascular: No hyperdense vessel or unexpected calcification. Skull: Normal. Negative for fracture or focal lesion. Sinuses/Orbits: No acute finding. Other: None IMPRESSION: Normal noncontrast CT of the brain. Electronically Signed   By: Elgie Collard M.D.   On: 10/10/2020 03:27   MR BRAIN WO CONTRAST  Result Date: 10/10/2020 CLINICAL DATA:  Neuro deficit, acute, stroke suspected EXAM: MRI HEAD WITHOUT CONTRAST TECHNIQUE: Multiplanar, multiecho pulse sequences of the brain and surrounding structures were obtained without intravenous contrast. COMPARISON:  MRI head May 25, 2020 FINDINGS: Brain: No acute infarction, hemorrhage, hydrocephalus, extra-axial collection or mass lesion. Vascular: Small right vertebral artery flow void, similar to prior and most likely non dominant. Otherwise, major arterial voids are maintained at the skull base. Skull and upper cervical spine: Normal marrow signal. Sinuses/Orbits: Negative. Other: Mild adenoid hypertrophy, frequently seen in patients this age. IMPRESSION: No evidence acute intracranial abnormality. No change from the prior. Electronically Signed   By: Feliberto Harts M.D.   On: 10/10/2020 11:44   CT ABDOMEN PELVIS W CONTRAST  Result Date: 10/10/2020 CLINICAL DATA:  Left-sided abdominal pain EXAM: CT ABDOMEN AND PELVIS WITH CONTRAST TECHNIQUE: Multidetector CT imaging of the abdomen and pelvis was performed using the standard protocol following bolus administration of intravenous contrast. CONTRAST:  OMNIPAQUE IOHEXOL 300 MG/ML  SOLN COMPARISON:  03/09/2020 FINDINGS: Lower chest: No acute abnormality. Hepatobiliary:  No focal liver  abnormality is seen. No gallstones, gallbladder wall thickening, or biliary dilatation. Pancreas: Unremarkable Spleen: Unremarkable Adrenals/Urinary Tract: Adrenal glands are unremarkable. Kidneys are normal, without renal calculi, focal lesion, or hydronephrosis. Bladder is unremarkable. Stomach/Bowel: Mild transverse and descending colonic diverticulosis. No superimposed acute inflammatory change. The stomach, small bowel, and large bowel are otherwise unremarkable. Appendix normal. No free intraperitoneal gas or fluid. Vascular/Lymphatic: No significant vascular findings are present. No enlarged abdominal or pelvic lymph nodes. Reproductive: Uterus and bilateral adnexa are unremarkable. Other: No abdominal wall hernia.  Rectum unremarkable. Musculoskeletal: No acute or significant osseous findings. IMPRESSION: No acute intra-abdominal pathology identified. No definite radiographic explanation for the patient's reported symptoms. Mild colonic diverticulosis without superimposed inflammatory change. Electronically Signed   By: Helyn NumbersAshesh  Parikh M.D.   On: 10/10/2020 03:30   EEG adult  Result Date: 10/10/2020 Charlsie QuestYadav, Priyanka O, MD     10/10/2020  2:17 PM Patient Name: Madison GalasCassandra E Clay MRN: [id number] Epilepsy Attending: Charlsie QuestPriyanka O Yadav Referring Physician/Provider: Dr Hazeline Junkeryan Grunz Date: 10/10/2020 Duration: 21.50 mins Patient history: 25 year old female with altered mental status.  EEG to evaluate for seizures. Level of alertness: Awake AEDs during EEG study: None Technical aspects: This EEG study was done with scalp electrodes positioned according to the 10-20 International system of electrode placement. Electrical activity was acquired at a sampling rate of 500Hz  and reviewed with a high frequency filter of 70Hz  and a low frequency filter of 1Hz . EEG data were recorded continuously and digitally stored. Description: The posterior dominant rhythm consists of 10 Hz activity of moderate voltage (25-35  uV) seen predominantly in posterior head regions, symmetric and reactive to eye opening and eye closing. There is an excessive amount of 15 to 18 Hz beta activity with irregular morphology distributed symmetrically and diffusely.  Physiologic photic driving was not seen during photic stimulation.  No EEG changes seen during hyperventilation.   ABNORMALITY - Excessive beta, generalized IMPRESSION: This study is within normal limits. The excessive beta activity seen in the background is most likely due to the effect of benzodiazepine and is a benign EEG pattern. No seizures or epileptiform discharges were seen throughout the recording. Priyanka Annabelle Harman Yadav   (Echo, Carotid, EGD, Colonoscopy, ERCP)    Subjective:   Discharge Exam: Vitals:   10/10/20 1958 10/11/20 0505  BP: 119/88 129/79  Pulse: 83 (!) 58  Resp: 18 18  Temp: 98 F (36.7 C) 97.7 F (36.5 C)  SpO2: 100% 99%     General: Pt is alert, awake, not in acute distress Cardiovascular: RRR, S1/S2 +, no rubs, no gallops Respiratory: CTA bilaterally, no wheezing, no rhonchi Abdominal: Soft, NT, ND, bowel sounds + Extremities: no edema, no cyanosis    The results of significant diagnostics from this hospitalization (including imaging, microbiology, ancillary and laboratory) are listed below for reference.     Microbiology: Recent Results (from the past 240 hour(s))  Resp Panel by RT-PCR (Flu A&B, Covid) Nasopharyngeal Swab     Status: None   Collection Time: 10/10/20  1:52 AM   Specimen: Nasopharyngeal Swab; Nasopharyngeal(NP) swabs in vial transport medium  Result Value Ref Range Status   SARS Coronavirus 2 by RT PCR NEGATIVE NEGATIVE Final    Comment: (NOTE) SARS-CoV-2 target nucleic acids are NOT DETECTED.  The SARS-CoV-2 RNA is generally detectable in upper respiratory specimens during the acute phase of infection. The lowest concentration of SARS-CoV-2 viral copies this assay can detect is 138 copies/mL. A negative result  does not preclude SARS-Cov-2 infection and  should not be used as the sole basis for treatment or other patient management decisions. A negative result may occur with  improper specimen collection/handling, submission of specimen other than nasopharyngeal swab, presence of viral mutation(s) within the areas targeted by this assay, and inadequate number of viral copies(<138 copies/mL). A negative result must be combined with clinical observations, patient history, and epidemiological information. The expected result is Negative.  Fact Sheet for Patients:  BloggerCourse.com  Fact Sheet for Healthcare Providers:  SeriousBroker.it  This test is no t yet approved or cleared by the [place] FDA and  has been authorized for detection and/or diagnosis of SARS-CoV-2 by FDA under an Emergency Use Authorization (EUA). This EUA will remain  in effect (meaning this test can be used) for the duration of the COVID-19 declaration under Section 564(b)(1) of the Act, 21 U.S.C.section 360bbb-3(b)(1), unless the authorization is terminated  or revoked sooner.       Influenza A by PCR NEGATIVE NEGATIVE Final   Influenza B by PCR NEGATIVE NEGATIVE Final    Comment: (NOTE) The Xpert Xpress SARS-CoV-2/FLU/RSV plus assay is intended as an aid in the diagnosis of influenza from Nasopharyngeal swab specimens and should not be used as a sole basis for treatment. Nasal washings and aspirates are unacceptable for Xpert Xpress SARS-CoV-2/FLU/RSV testing.  Fact Sheet for Patients: BloggerCourse.com  Fact Sheet for Healthcare Providers: SeriousBroker.it  This test is not yet approved or cleared by the [place] FDA and has been authorized for detection and/or diagnosis of SARS-CoV-2 by FDA under an Emergency Use Authorization (EUA). This EUA will remain in effect (meaning this test can be used) for  the duration of the COVID-19 declaration under Section 564(b)(1) of the Act, 21 U.S.C. section 360bbb-3(b)(1), unless the authorization is terminated or revoked.  Performed at [facility], [address], [place], [place] [address]      Labs: BNP (last 3 results) No results for input(s): BNP in the last 8760 hours. Basic Metabolic Panel: Recent Labs  Lab 10/10/20 0151 10/11/20 0537  NA 136 139  K 2.8* 4.0  CL 107 106  CO2 16* 26  GLUCOSE 172* 88  BUN 11 7  CREATININE 1.02* 0.62  CALCIUM 9.1 9.3  MG  --  2.0   Liver Function Tests: Recent Labs  Lab 10/10/20 0151 10/11/20 0537  AST 23 9*  ALT 26 19  ALKPHOS 53 42  BILITOT 0.4 0.6  PROT 7.9 7.0  ALBUMIN 4.7 4.1   Recent Labs  Lab 10/10/20 0151  LIPASE 28   No results for input(s): AMMONIA in the last 168 hours. CBC: Recent Labs  Lab 10/10/20 0151 10/11/20 0537  WBC 13.7* 7.5  NEUTROABS 9.0*  --   HGB 14.1 12.5  HCT 41.3 38.2  MCV 88.4 91.2  PLT 368 272   Cardiac Enzymes: No results for input(s): CKTOTAL, CKMB, CKMBINDEX, TROPONINI in the last 168 hours. BNP: Invalid input(s): POCBNP CBG: Recent Labs  Lab 10/10/20 0148  GLUCAP 149*   D-Dimer No results for input(s): DDIMER in the last 72 hours. Hgb A1c Recent Labs    10/11/20 0537  HGBA1C 5.6   Lipid Profile Recent Labs    10/11/20 0537  CHOL 170  HDL 40*  LDLCALC 105*  TRIG 126  CHOLHDL 4.3   Thyroid function studies No results for input(s): TSH, T4TOTAL, T3FREE, THYROIDAB in the last 72 hours.  Invalid input(s): FREET3 Anemia work up No results for input(s): VITAMINB12, FOLATE, FERRITIN, TIBC, IRON, RETICCTPCT  in the last 72 hours. Urinalysis    Component Value Date/Time   COLORURINE YELLOW 10/10/2020 0151   APPEARANCEUR HAZY (A) 10/10/2020 0151   LABSPEC 1.040 (H) 10/10/2020 0151   PHURINE 6.0 10/10/2020 0151   GLUCOSEU NEGATIVE 10/10/2020 0151   HGBUR MODERATE (A) 10/10/2020 0151   BILIRUBINUR NEGATIVE 10/10/2020 0151    BILIRUBINUR negative 09/29/2020 1634   KETONESUR 5 (A) 10/10/2020 0151   PROTEINUR 30 (A) 10/10/2020 0151   UROBILINOGEN 0.2 09/29/2020 1634   NITRITE NEGATIVE 10/10/2020 0151   LEUKOCYTESUR TRACE (A) 10/10/2020 0151   Sepsis Labs Invalid input(s): PROCALCITONIN,  WBC,  LACTICIDVEN Microbiology Recent Results (from the past 240 hour(s))  Resp Panel by RT-PCR (Flu A&B, Covid) Nasopharyngeal Swab     Status: None   Collection Time: 10/10/20  1:52 AM   Specimen: Nasopharyngeal Swab; Nasopharyngeal(NP) swabs in vial transport medium  Result Value Ref Range Status   SARS Coronavirus 2 by RT PCR NEGATIVE NEGATIVE Final    Comment: (NOTE) SARS-CoV-2 target nucleic acids are NOT DETECTED.  The SARS-CoV-2 RNA is generally detectable in upper respiratory specimens during the acute phase of infection. The lowest concentration of SARS-CoV-2 viral copies this assay can detect is 138 copies/mL. A negative result does not preclude SARS-Cov-2 infection and should not be used as the sole basis for treatment or other patient management decisions. A negative result may occur with  improper specimen collection/handling, submission of specimen other than nasopharyngeal swab, presence of viral mutation(s) within the areas targeted by this assay, and inadequate number of viral copies(<138 copies/mL). A negative result must be combined with clinical observations, patient history, and epidemiological information. The expected result is Negative.  Fact Sheet for Patients:  BloggerCourse.com  Fact Sheet for Healthcare Providers:  SeriousBroker.it  This test is no t yet approved or cleared by the [place] FDA and  has been authorized for detection and/or diagnosis of SARS-CoV-2 by FDA under an Emergency Use Authorization (EUA). This EUA will remain  in effect (meaning this test can be used) for the duration of the COVID-19 declaration under  Section 564(b)(1) of the Act, 21 U.S.C.section 360bbb-3(b)(1), unless the authorization is terminated  or revoked sooner.       Influenza A by PCR NEGATIVE NEGATIVE Final   Influenza B by PCR NEGATIVE NEGATIVE Final    Comment: (NOTE) The Xpert Xpress SARS-CoV-2/FLU/RSV plus assay is intended as an aid in the diagnosis of influenza from Nasopharyngeal swab specimens and should not be used as a sole basis for treatment. Nasal washings and aspirates are unacceptable for Xpert Xpress SARS-CoV-2/FLU/RSV testing.  Fact Sheet for Patients: BloggerCourse.com  Fact Sheet for Healthcare Providers: SeriousBroker.it  This test is not yet approved or cleared by the [place] FDA and has been authorized for detection and/or diagnosis of SARS-CoV-2 by FDA under an Emergency Use Authorization (EUA). This EUA will remain in effect (meaning this test can be used) for the duration of the COVID-19 declaration under Section 564(b)(1) of the Act, 21 U.S.C. section 360bbb-3(b)(1), unless the authorization is terminated or revoked.  Performed at [facility], [address] [place], [place] [address]      Time coordinating discharge: 40 minutes  SIGNED:   Alba Cory, MD  [facility]

## 2020-10-11 NOTE — Plan of Care (Signed)
  Problem: Education: Goal: Knowledge of General Education information will improve Description: Including pain rating scale, medication(s)/side effects and non-pharmacologic comfort measures Outcome: Progressing   Problem: Education: Goal: Knowledge of disease or condition will improve Outcome: Progressing Goal: Knowledge of secondary prevention will improve Outcome: Progressing   Problem: Coping: Goal: Will verbalize positive feelings about self Outcome: Progressing Goal: Will identify appropriate support needs Outcome: Progressing

## 2022-01-09 IMAGING — CT CT ABD-PELV W/ CM
2 of 4 series · 17 of 46 positions shown, 19 images · IV contrast (Omnipaque or Isovue)
Comparison: None

CLINICAL DATA: Left lower quadrant pain, nausea, constipation 1
week.

EXAM:
CT ABDOMEN AND PELVIS WITH CONTRAST
TECHNIQUE: Multidetector CT imaging of the abdomen and pelvis was performed
using the standard protocol following bolus administration of
intravenous contrast.
CONTRAST:  100mL OMNIPAQUE IOHEXOL 300 MG/ML  SOLN

[Series 2: axial st · axial · 0.98mm/px · z∈[+768,+1223]mm · 14 of 99 slices shown, 16 images]
[im 4/99  soft-tissue]
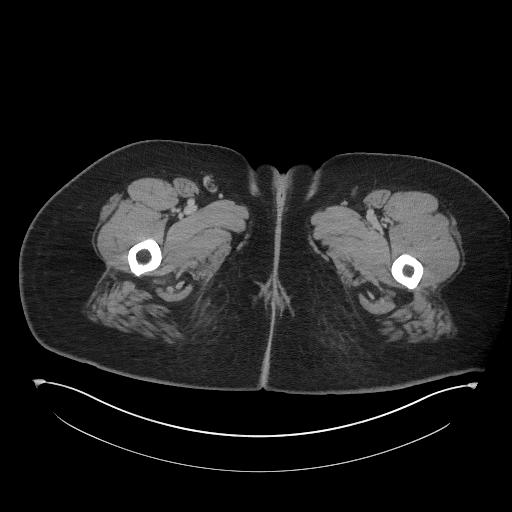
[im 4/99  bone]
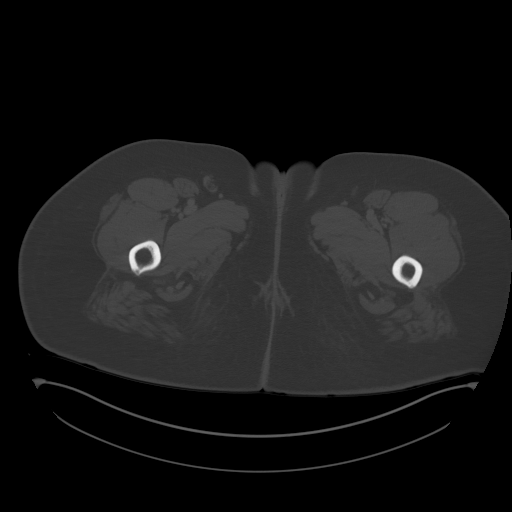
[im 12/99  soft-tissue]
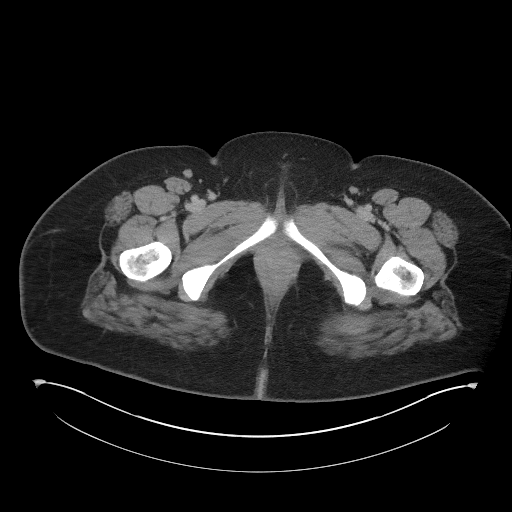
[im 20/99  soft-tissue]
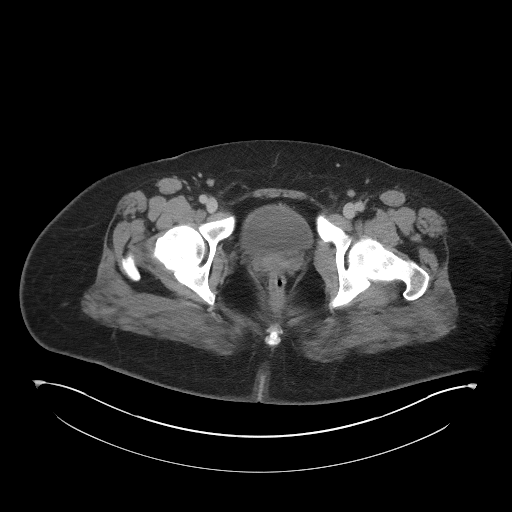
[im 28/99  soft-tissue]
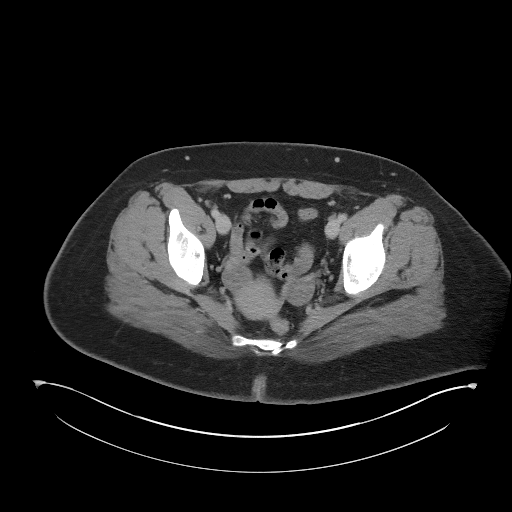
[im 32/99  soft-tissue]
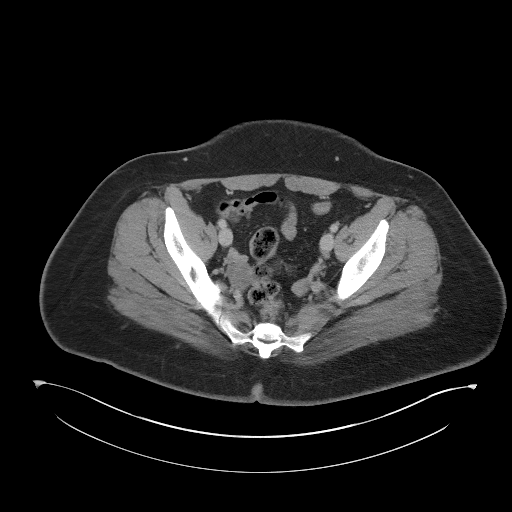
[im 40/99  soft-tissue]
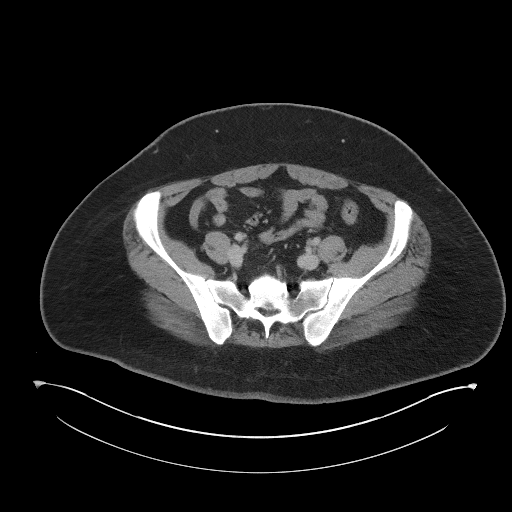
[im 48/99  soft-tissue]
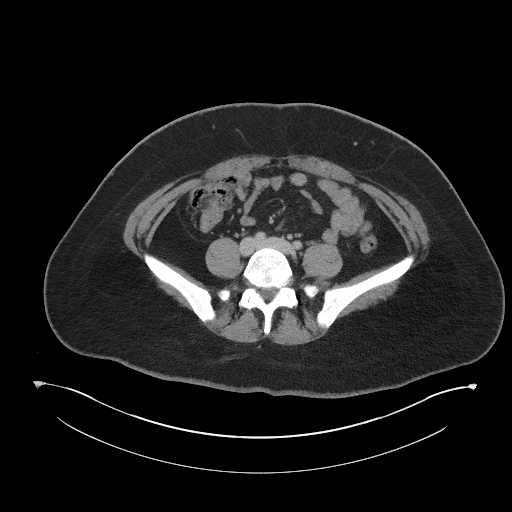
[im 51/99  soft-tissue]
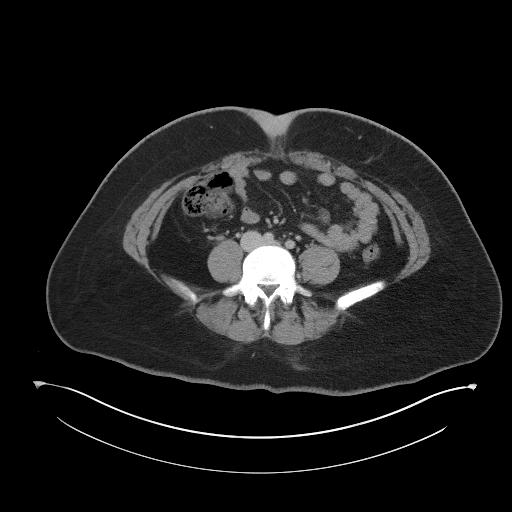
[im 59/99  soft-tissue]
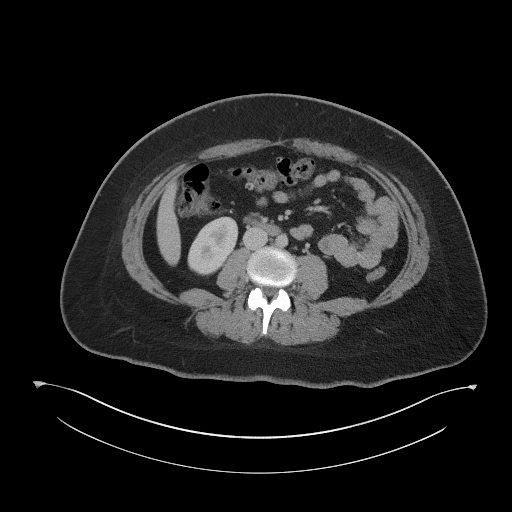
[im 59/99  bone]
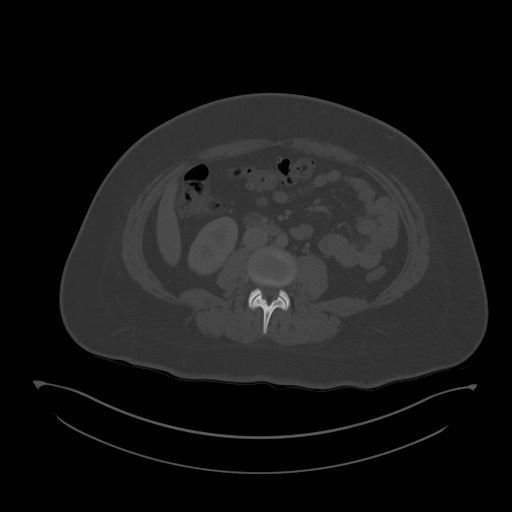
[im 67/99  soft-tissue]
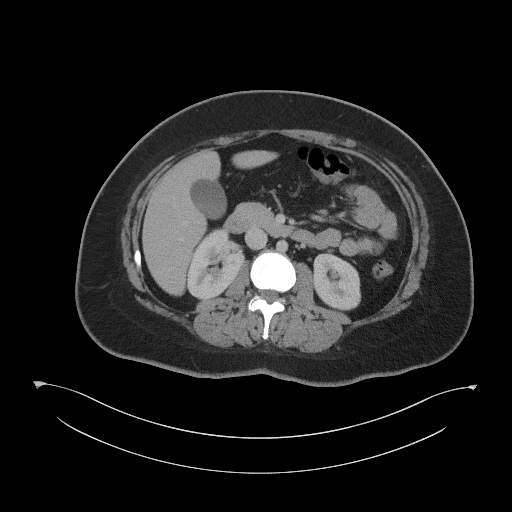
[im 75/99  soft-tissue]
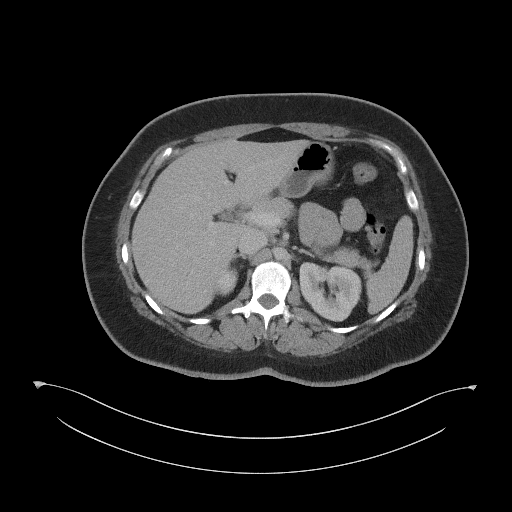
[im 79/99  soft-tissue]
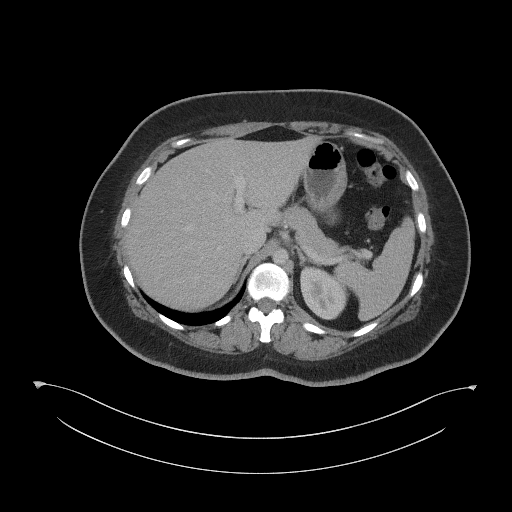
[im 87/99  soft-tissue]
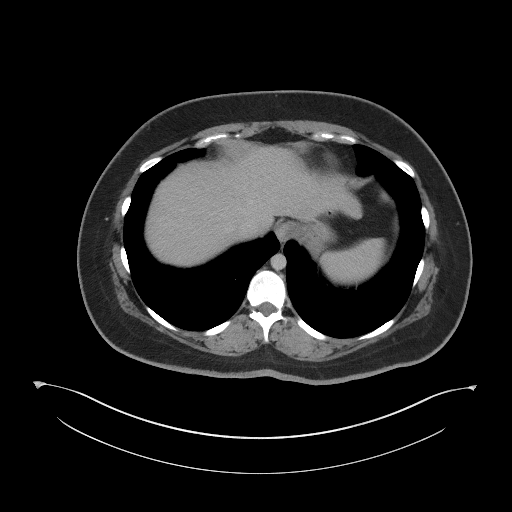
[im 95/99  soft-tissue]
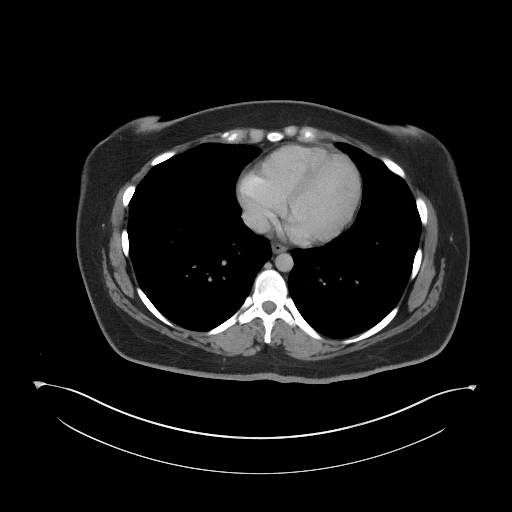

[Series 5: coronal st · coronal · 0.93mm/px · 3 of 117 slices shown]
[im 39/117  soft-tissue]
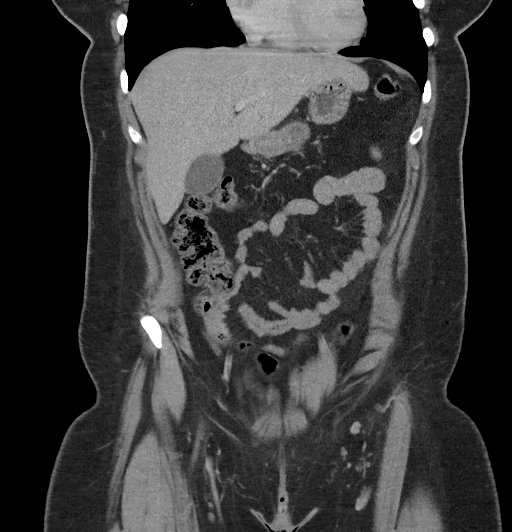
[im 52/117  soft-tissue]
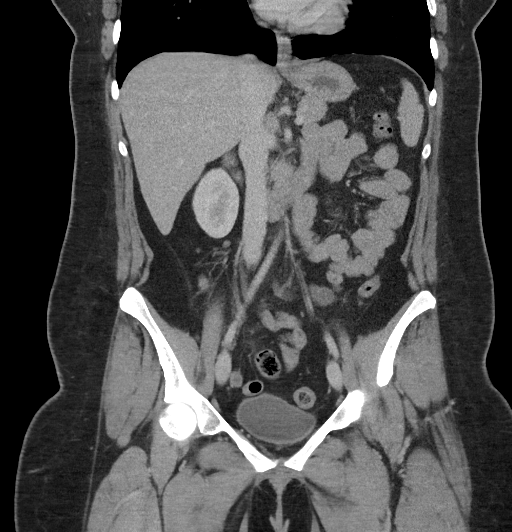
[im 65/117  soft-tissue]
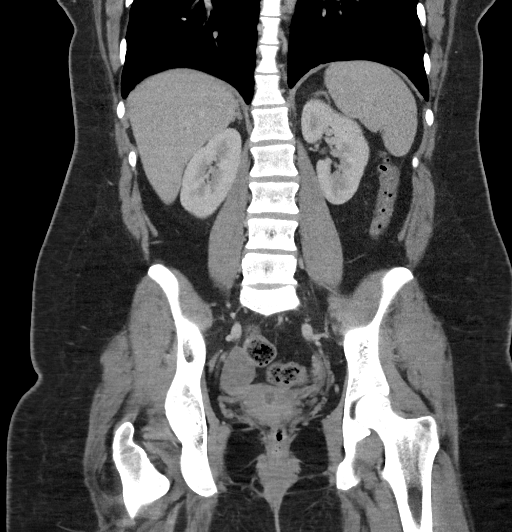

[17 of 46 positions shown; findings below may reference images not displayed]

FINDINGS: Lower chest: No acute abnormality.

Hepatobiliary: No focal liver abnormality. No gallstones,
gallbladder wall thickening, or pericholecystic fluid. No biliary
dilatation.

Pancreas: No focal lesion. Normal pancreatic contour. No surrounding
inflammatory changes. No main pancreatic ductal dilatation.

Spleen: Normal in size without focal abnormality.

Adrenals/Urinary Tract: No adrenal nodule bilaterally. Bilateral
kidneys enhance symmetrically. No hydronephrosis. No hydroureter.
The urinary bladder is unremarkable.

Stomach/Bowel: Stomach is within normal limits. No evidence of bowel
wall thickening or dilatation. Few scattered colonic diverticula.
The appendix measures slightly enlarged in caliber bone (8 mm). No
appendicolith identified. No associated right lower quadrant
inflammatory changes.

Vascular/Lymphatic: No significant vascular findings are present. No
enlarged abdominal or pelvic lymph nodes.

Reproductive: The uterus is retroflexed. Uterus and bilateral adnexa
are unremarkable.

Other: No intraperitoneal free fluid. No intraperitoneal free gas.
No organized fluid collection.

Musculoskeletal: No acute or significant osseous findings.
IMPRESSION: 1. The appendix measures at the upper limits of normal/borderline
enlarged with no associated appendicoliths, no definite appendiceal
wall thickening, or definite right lower quadrant inflammatory
changes. Findings likely represent a normal appendix. Recommend
clinical correlation.
2. Few scattered colonic diverticula with no acute diverticulitis.
